# Patient Record
Sex: Male | Born: 1950 | Race: White | Hispanic: No | Marital: Married | Smoking: Never smoker
Health system: Southern US, Community
[De-identification: ages and names within clinical notes are randomized; demographics above are authoritative.]

## PROBLEM LIST (undated history)

## (undated) DIAGNOSIS — K633 Ulcer of intestine: Secondary | ICD-10-CM

## (undated) DIAGNOSIS — K5790 Diverticulosis of intestine, part unspecified, without perforation or abscess without bleeding: Secondary | ICD-10-CM

## (undated) DIAGNOSIS — K219 Gastro-esophageal reflux disease without esophagitis: Secondary | ICD-10-CM

## (undated) DIAGNOSIS — K648 Other hemorrhoids: Secondary | ICD-10-CM

## (undated) DIAGNOSIS — I493 Ventricular premature depolarization: Secondary | ICD-10-CM

## (undated) DIAGNOSIS — K589 Irritable bowel syndrome without diarrhea: Secondary | ICD-10-CM

## (undated) DIAGNOSIS — R112 Nausea with vomiting, unspecified: Secondary | ICD-10-CM

## (undated) DIAGNOSIS — K449 Diaphragmatic hernia without obstruction or gangrene: Secondary | ICD-10-CM

## (undated) DIAGNOSIS — Z974 Presence of external hearing-aid: Secondary | ICD-10-CM

## (undated) DIAGNOSIS — Z9889 Other specified postprocedural states: Secondary | ICD-10-CM

## (undated) HISTORY — DX: Ulcer of intestine: K63.3

## (undated) HISTORY — DX: Ventricular premature depolarization: I49.3

## (undated) HISTORY — DX: Other hemorrhoids: K64.8

## (undated) HISTORY — DX: Diverticulosis of intestine, part unspecified, without perforation or abscess without bleeding: K57.90

## (undated) HISTORY — PX: TONSILLECTOMY: SUR1361

## (undated) HISTORY — PX: COLON SURGERY: SHX602

## (undated) HISTORY — PX: NASAL SEPTUM SURGERY: SHX37

## (undated) HISTORY — DX: Irritable bowel syndrome, unspecified: K58.9

## (undated) HISTORY — DX: Diaphragmatic hernia without obstruction or gangrene: K44.9

## (undated) HISTORY — DX: Gastro-esophageal reflux disease without esophagitis: K21.9

---

## 2002-09-14 ENCOUNTER — Encounter: Payer: Self-pay | Admitting: Gastroenterology

## 2002-09-14 ENCOUNTER — Ambulatory Visit (HOSPITAL_COMMUNITY): Admission: RE | Admit: 2002-09-14 | Discharge: 2002-09-14 | Payer: Self-pay | Admitting: Gastroenterology

## 2002-09-19 ENCOUNTER — Encounter: Payer: Self-pay | Admitting: Gastroenterology

## 2004-10-14 ENCOUNTER — Ambulatory Visit: Payer: Self-pay | Admitting: Gastroenterology

## 2004-10-20 ENCOUNTER — Ambulatory Visit: Payer: Self-pay | Admitting: Gastroenterology

## 2004-11-11 ENCOUNTER — Ambulatory Visit: Payer: Self-pay | Admitting: Gastroenterology

## 2005-02-06 ENCOUNTER — Encounter: Admission: RE | Admit: 2005-02-06 | Discharge: 2005-02-06 | Payer: Self-pay | Admitting: General Surgery

## 2005-02-10 ENCOUNTER — Ambulatory Visit (HOSPITAL_BASED_OUTPATIENT_CLINIC_OR_DEPARTMENT_OTHER): Admission: RE | Admit: 2005-02-10 | Discharge: 2005-02-10 | Payer: Self-pay | Admitting: General Surgery

## 2005-02-10 ENCOUNTER — Encounter (INDEPENDENT_AMBULATORY_CARE_PROVIDER_SITE_OTHER): Payer: Self-pay | Admitting: Specialist

## 2005-02-10 ENCOUNTER — Ambulatory Visit (HOSPITAL_COMMUNITY): Admission: RE | Admit: 2005-02-10 | Discharge: 2005-02-10 | Payer: Self-pay | Admitting: General Surgery

## 2005-10-22 ENCOUNTER — Ambulatory Visit: Payer: Self-pay | Admitting: Gastroenterology

## 2006-01-14 ENCOUNTER — Ambulatory Visit: Payer: Self-pay | Admitting: Gastroenterology

## 2006-09-29 ENCOUNTER — Ambulatory Visit: Payer: Self-pay | Admitting: Gastroenterology

## 2007-01-04 ENCOUNTER — Encounter: Admission: RE | Admit: 2007-01-04 | Discharge: 2007-01-04 | Payer: Self-pay | Admitting: Family Medicine

## 2007-01-26 ENCOUNTER — Encounter: Admission: RE | Admit: 2007-01-26 | Discharge: 2007-01-26 | Payer: Self-pay | Admitting: General Surgery

## 2007-04-04 ENCOUNTER — Inpatient Hospital Stay (HOSPITAL_COMMUNITY): Admission: RE | Admit: 2007-04-04 | Discharge: 2007-04-08 | Payer: Self-pay | Admitting: General Surgery

## 2007-04-04 ENCOUNTER — Encounter (INDEPENDENT_AMBULATORY_CARE_PROVIDER_SITE_OTHER): Payer: Self-pay | Admitting: Specialist

## 2007-04-04 ENCOUNTER — Encounter (INDEPENDENT_AMBULATORY_CARE_PROVIDER_SITE_OTHER): Payer: Self-pay | Admitting: *Deleted

## 2007-06-13 ENCOUNTER — Ambulatory Visit (HOSPITAL_BASED_OUTPATIENT_CLINIC_OR_DEPARTMENT_OTHER): Admission: RE | Admit: 2007-06-13 | Discharge: 2007-06-13 | Payer: Self-pay | Admitting: Urology

## 2007-06-13 ENCOUNTER — Encounter (INDEPENDENT_AMBULATORY_CARE_PROVIDER_SITE_OTHER): Payer: Self-pay | Admitting: Urology

## 2007-10-03 ENCOUNTER — Ambulatory Visit: Payer: Self-pay | Admitting: Gastroenterology

## 2008-02-03 DIAGNOSIS — K648 Other hemorrhoids: Secondary | ICD-10-CM | POA: Insufficient documentation

## 2008-02-03 DIAGNOSIS — K449 Diaphragmatic hernia without obstruction or gangrene: Secondary | ICD-10-CM | POA: Insufficient documentation

## 2008-02-03 DIAGNOSIS — K219 Gastro-esophageal reflux disease without esophagitis: Secondary | ICD-10-CM

## 2008-02-03 DIAGNOSIS — K589 Irritable bowel syndrome without diarrhea: Secondary | ICD-10-CM

## 2008-02-03 DIAGNOSIS — K2980 Duodenitis without bleeding: Secondary | ICD-10-CM | POA: Insufficient documentation

## 2008-02-03 DIAGNOSIS — K5730 Diverticulosis of large intestine without perforation or abscess without bleeding: Secondary | ICD-10-CM | POA: Insufficient documentation

## 2008-04-21 IMAGING — RF DG BE W/ CM - WO/W KUB
16 series · 16 of 16 positions shown · non-contrast
Comparison: none

CLINICAL DATA: Recurrent diverticulitis.  
 BARIUM ENEMA: 
 Scout KUB demonstrates that the patient has bilateral pars defects at L5 with degenerative disc disease at that level.  The bowel gas pattern is normal.  The entire colon was filled in the normal retrograde Nya.  There was reflux into the terminal ileum.  The patient has extensive diverticulosis of the sigmoid portion of the colon as well as numerous diverticula in the entire descending colon with some scattered diverticula in the distal transverse colon and some rare diverticula in the ascending colon.  No mass lesions or polyps.  No evidence of extravasation.  No strictures or evidence of acute diverticulitis at this time.  
 Appendix was not visualized.

[Series 1: run · 1 of 1 slices shown (1 of 14)]
[im 1/1]
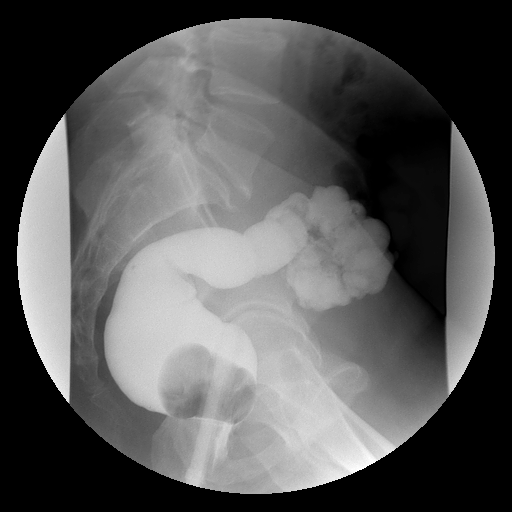

[Series 2: run · 1 of 1 slices shown (2 of 14)]
[im 1/1]
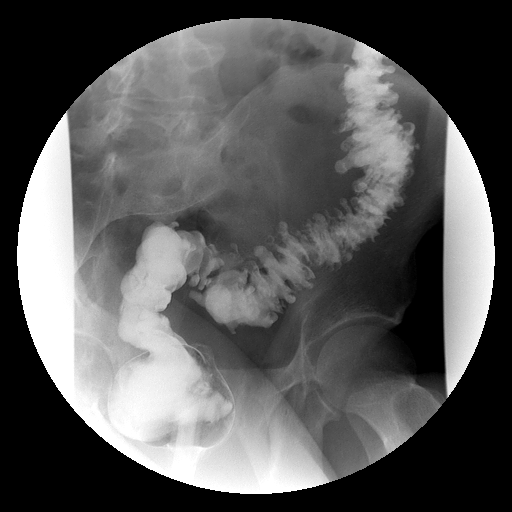

[Series 3: run · 1 of 1 slices shown (3 of 14)]
[im 1/1]
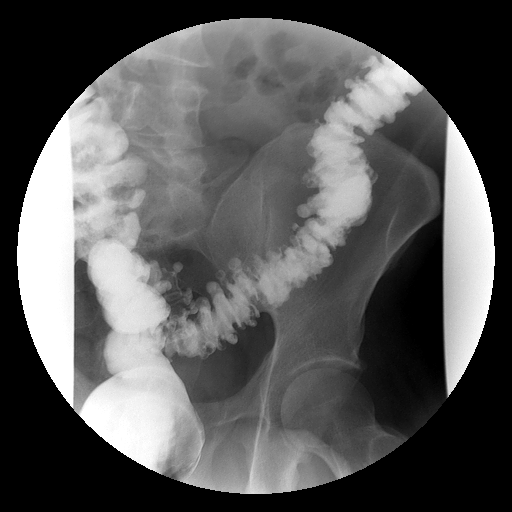

[Series 4: run · 1 of 1 slices shown (4 of 14)]
[im 1/1]
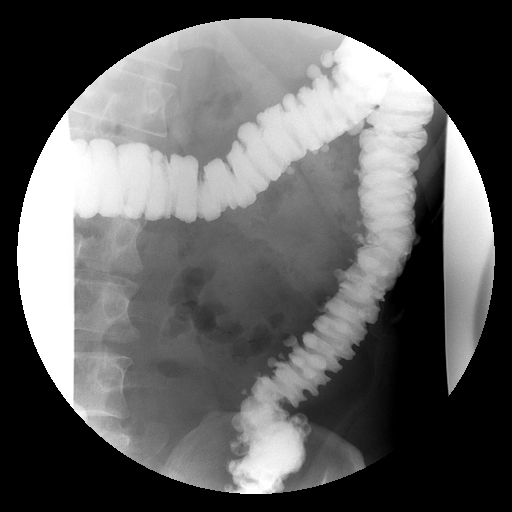

[Series 5: run · 1 of 1 slices shown (5 of 14)]
[im 1/1]
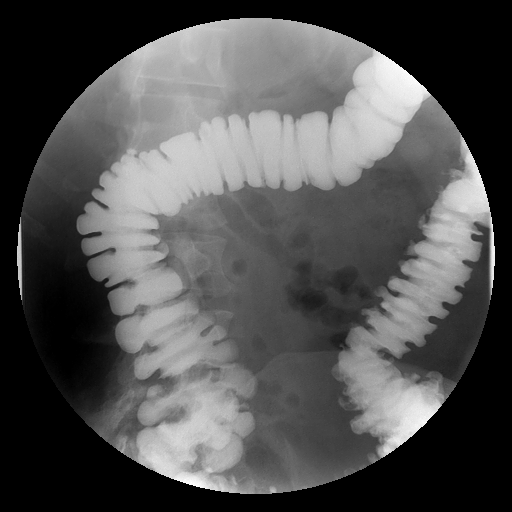

[Series 6: run · 1 of 1 slices shown (6 of 14)]
[im 1/1]
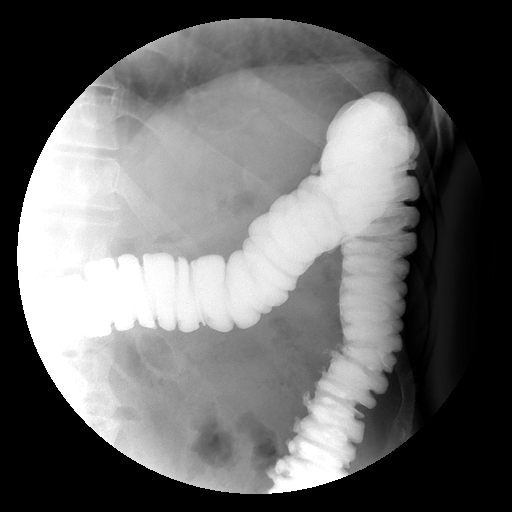

[Series 7: run · 1 of 1 slices shown (7 of 14)]
[im 1/1]
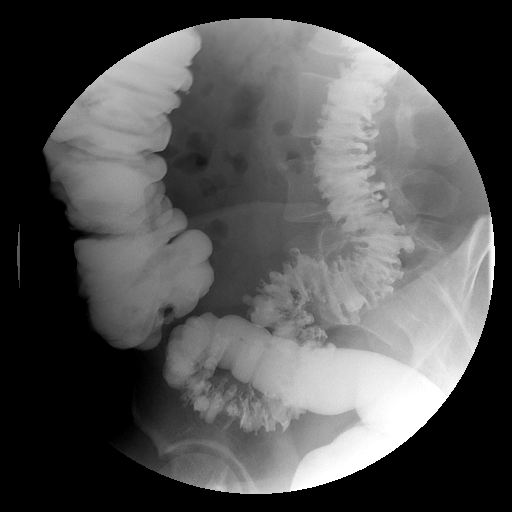

[Series 8: run · 1 of 1 slices shown (8 of 14)]
[im 1/1]
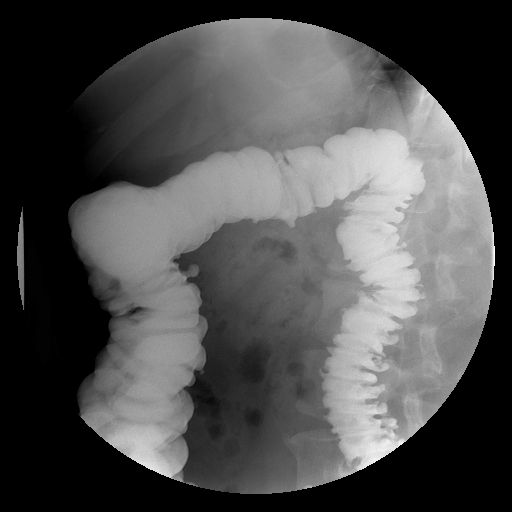

[Series 9: run · 1 of 1 slices shown (9 of 14)]
[im 1/1]
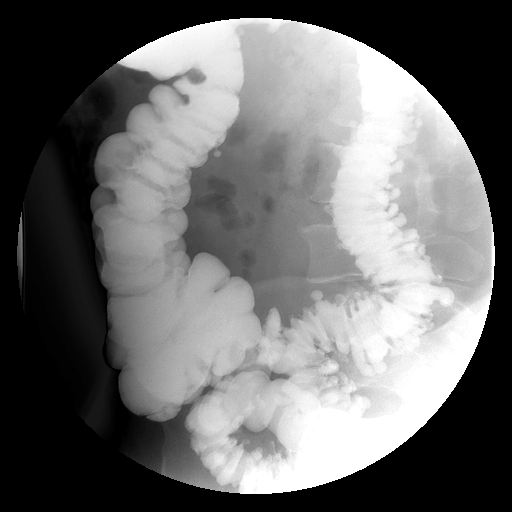

[Series 10: run · 1 of 1 slices shown (10 of 14)]
[im 1/1]
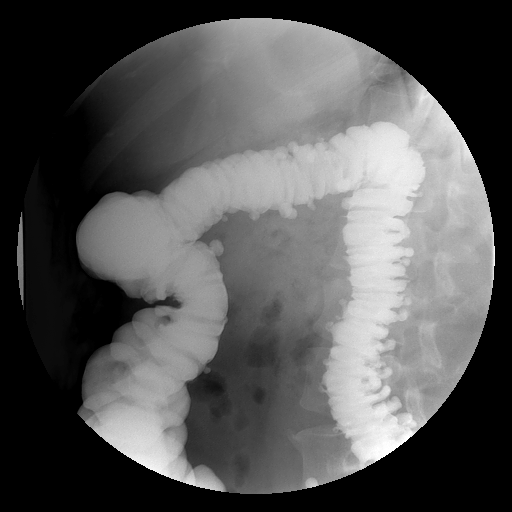

[Series 11: run · 1 of 1 slices shown (11 of 14)]
[im 1/1]
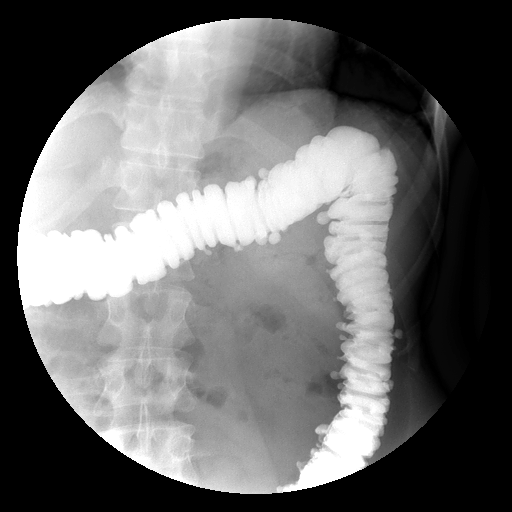

[Series 12: run · 1 of 1 slices shown (12 of 14)]
[im 1/1]
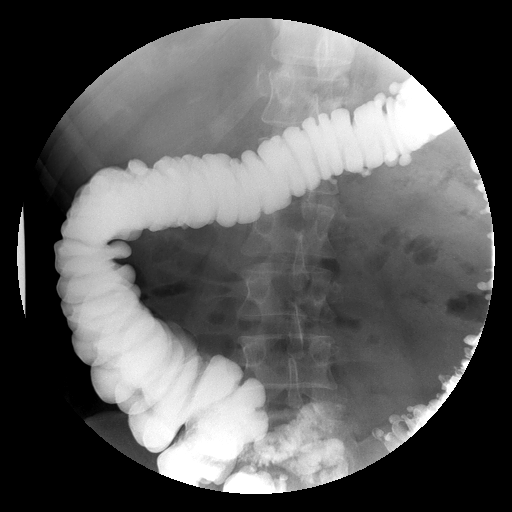

[Series 13: run · 1 of 1 slices shown (13 of 14)]
[im 1/1]
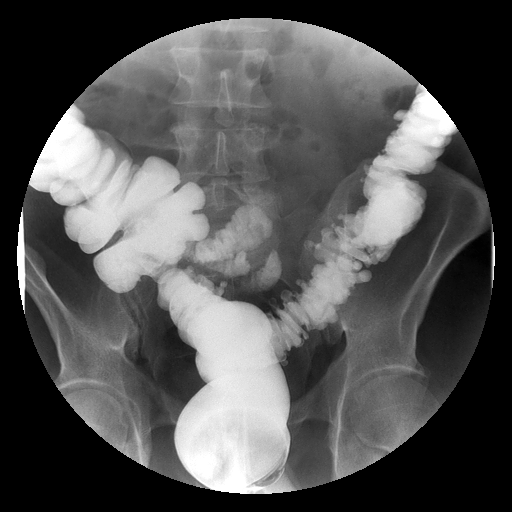

[Series 14: run · 1 of 1 slices shown (14 of 14)]
[im 1/1]
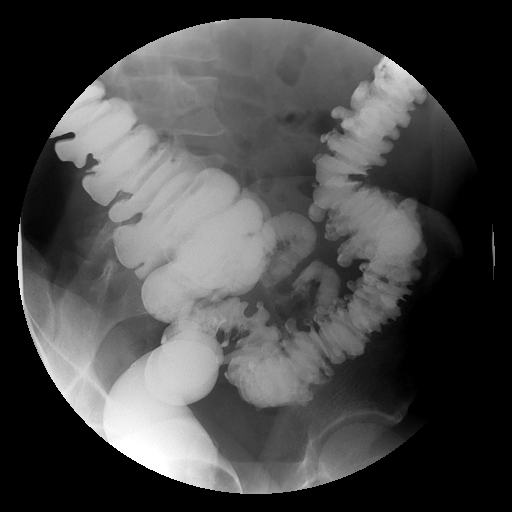

[Series 1001: view not recorded · 0.20mm/px · 1 of 1 slices shown (1 of 2)]
[im 1/1]
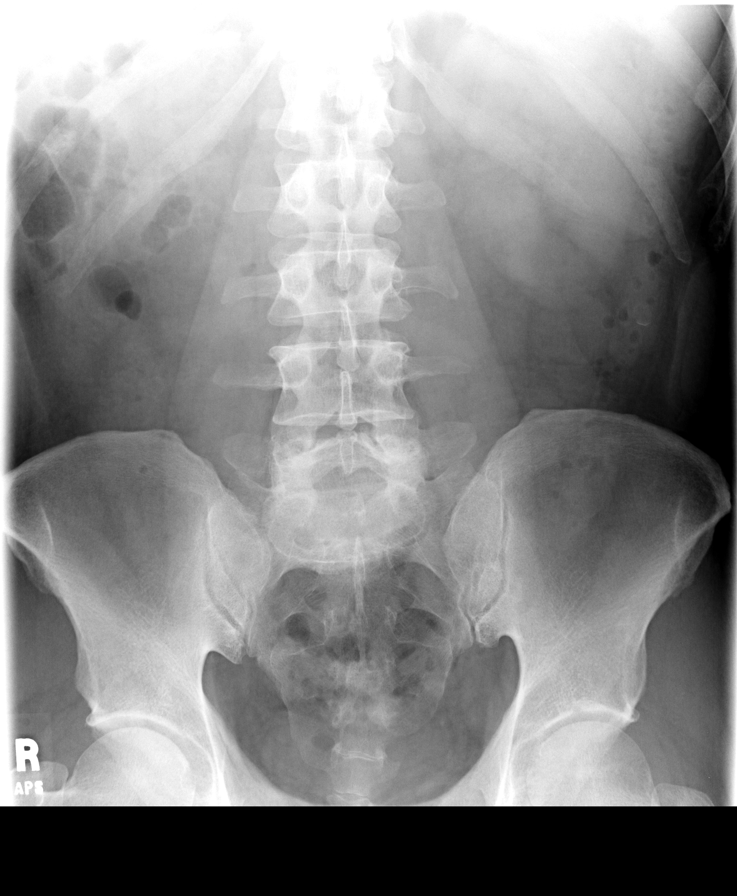

[Series 1002: view not recorded · 0.20mm/px · 1 of 1 slices shown (2 of 2)]
[im 1/1]
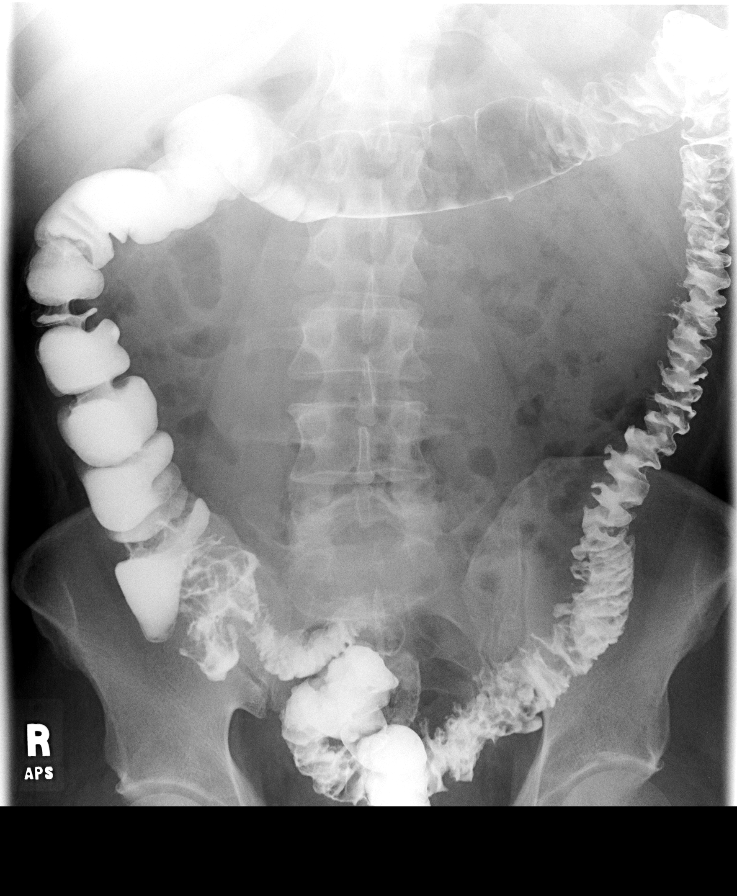

[16 of 16 positions shown; findings below may reference images not displayed]

IMPRESSION: Extensive diverticulosis of the descending and sigmoid portions of the colon with a few scattered diverticula in the remainder of the colon.

## 2008-09-19 ENCOUNTER — Ambulatory Visit: Payer: Self-pay | Admitting: Gastroenterology

## 2008-11-05 ENCOUNTER — Telehealth: Payer: Self-pay | Admitting: Gastroenterology

## 2009-10-14 ENCOUNTER — Ambulatory Visit: Payer: Self-pay | Admitting: Gastroenterology

## 2010-10-10 ENCOUNTER — Telehealth: Payer: Self-pay | Admitting: Gastroenterology

## 2010-11-24 ENCOUNTER — Ambulatory Visit: Payer: Self-pay | Admitting: Gastroenterology

## 2010-11-25 ENCOUNTER — Ambulatory Visit: Payer: Self-pay | Admitting: Gastroenterology

## 2011-01-06 NOTE — Progress Notes (Signed)
Summary: refills?  Medications Added NEXIUM 40 MG CPDR (ESOMEPRAZOLE MAGNESIUM) one tablet by mouth two times a day       Phone Note Call from Patient Call back at Home Phone [phone]   Caller: Patient Call For: Dr. Russella Dar Reason for Call: Talk to Nurse Summary of Call: wants to know if he has any more refills for Nexium or if he needs to have an ov first Initial call taken by: Vallarie Mare,  October 10, 2010 9:44 AM  Follow-up for Phone Call        Scheduled pt for a follow-up visit on 11/24/10 at 3:15pm. Pt verbalized understanding. Pt given refills until his appt.  Follow-up by: Christie Nottingham CMA Duncan Dull),  October 10, 2010 10:06 AM    New/Updated Medications: NEXIUM 40 MG CPDR (ESOMEPRAZOLE MAGNESIUM) one tablet by mouth two times a day Prescriptions: NEXIUM 40 MG CPDR (ESOMEPRAZOLE MAGNESIUM) one tablet by mouth two times a day  #60 x 1   Entered by:   Christie Nottingham CMA (AAMA)   Authorized by:   Meryl Dare MD [facility]   Signed by:   Christie Nottingham CMA (AAMA) on 10/10/2010   Method used:   Electronically to        [facility] [id number] (retail)       [address]       [place], [place]  [address]       Ph: [phone]       Fax: [phone]   RxID:   [id number]

## 2011-01-08 NOTE — Assessment & Plan Note (Signed)
Summary: GERD f/u, yearly f/u/all   History of Present Illness Visit Type: Follow-up Visit Primary GI MD: Elie Goody MD [facility] Primary Provider: Lonie Peak, PA Requesting Provider: na Chief Complaint: Yearly f/u for GERD. Pt denies any GI complaints  History of Present Illness:   Mr. Ging returns for followup of GERD. He states his reflux symptoms remain under very good control on Nexium twice daily. Occasionally he notes regurgitation after eating a large meal. He has no other gastrointestinal complaints.   GI Review of Systems      Denies abdominal pain, acid reflux, belching, bloating, chest pain, dysphagia with liquids, dysphagia with solids, heartburn, loss of appetite, nausea, vomiting, vomiting blood, weight loss, and  weight gain.        Denies anal fissure, black tarry stools, change in bowel habit, constipation, diarrhea, diverticulosis, fecal incontinence, heme positive stool, hemorrhoids, irritable bowel syndrome, jaundice, light color stool, liver problems, rectal bleeding, and  rectal pain. Current Medications (verified): 1)  Folic Acid 1 Mg Tabs (Folic Acid) .... Take One By Mouth Once Daily 2)  Nexium 40 Mg Cpdr (Esomeprazole Magnesium) .... One Tablet By Mouth Two Times A Day  Allergies (verified): No Known Drug Allergies  Past History:  Past Medical History: GERD Irritable Bowel Syndrome Diverticulosis/diverticulitis Hemorrhoids Mild idiopathic cardiomyopathy 1996 PVC's Hiatal hernia Colonic Ulcer   Past Surgical History: Reviewed history from 10/14/2009 and no changes required. injections and banding of hemorrhoids in 2001& 2002 sinus surgery Sogmoid colectomy, diverticulitis, 03/2007  Family History: Reviewed history from 10/14/2009 and no changes required. No FH of Colon Cancer  Social History: Reviewed history from 10/14/2009 and no changes required. Patient has never smoked.  Alcohol Use - yes rare Daily Caffeine Use- 3 cups  daily Illicit Drug Use - no Patient does not get regular exercise.   Vital Signs:  Patient profile:   60 year old male Height:      72 inches Weight:      200 pounds BMI:     27.22 BSA:     2.13 Pulse rate:   96 / minute Pulse rhythm:   regular BP sitting:   128 / 74  (left arm) Cuff size:   regular  Vitals Entered By: Ok Anis CMA (November 24, 2010 3:21 PM)  Physical Exam  General:  Well developed, well nourished, no acute distress. Head:  Normocephalic and atraumatic. Eyes:  PERRLA, no icterus. Mouth:  No deformity or lesions, dentition normal. Lungs:  Clear throughout to auscultation. Heart:  Regular rate and rhythm; no murmurs, rubs,  or bruits. Abdomen:  Soft, nontender and nondistended. No masses, hepatosplenomegaly or hernias noted. Normal bowel sounds. Psych:  Alert and cooperative. Normal mood and affect.  Impression & Recommendations:  Problem # 1:  GASTROESOPHAGEAL REFLUX DISEASE (ICD-530.81) Continue Nexium 40 mg twice daily, and standard antireflux measures. Scheduled a bone density study screen for osteoporosis, osteopenia. Orders: T-Bone Densitometry [phone])  Problem # 2:  SCREENING COLORECTAL-CANCER (ICD-V76.51) Colon cancer screening due October 2013.  Patient Instructions: 1)  You have been scheduled for a Bone Density scan.  2)  Nexium has been sent to your pharmacy. 3)  Please schedule a follow-up appointment in 1 year. 4)  Copy sent to : Lonie Peak, PA 5)  The medication list was reviewed and reconciled.  All changed / newly prescribed medications were explained.  A complete medication list was provided to the patient / caregiver.  Prescriptions: NEXIUM 40 MG CPDR (ESOMEPRAZOLE MAGNESIUM) one tablet by  mouth two times a day  #68 x 11   Entered by:   Christie Nottingham CMA (AAMA)   Authorized by:   Meryl Dare MD [facility]   Signed by:   Christie Nottingham CMA (AAMA) on 11/24/2010   Method used:   Electronically to        [facility] [id number]  (retail)       [address]       [place], [place]  [address]       Ph: [phone]       Fax: [phone]   RxID:   [id number]

## 2011-04-21 NOTE — Op Note (Signed)
[name] Simmons, [name] Simmons                ACCOUNT NO.:  [id number]   MEDICAL RECORD NO.:  [id number]          PATIENT TYPE:  AMB   LOCATION:  [facility]                         FACILITY:  [facility]   PHYSICIAN:  Valetta Fuller, M.D.  DATE OF BIRTH:  1951-11-24   DATE OF PROCEDURE:  06/13/2007  DATE OF DISCHARGE:                               OPERATIVE REPORT   PREOPERATIVE DIAGNOSIS:  Left cystic epididymal mass.   POSTOPERATIVE DIAGNOSIS:  Left cystic epididymal mass.   PROCEDURE:  Scrotal exploration with excision of left complex  multiloculated spermatocele and partial epididectomy.   SURGEON:  Valetta Fuller, M.D.   ANESTHESIA:  General.   INDICATIONS FOR PROCEDURE:  [name] Simmons is a 60 year old male.  I  evaluated him back in February of this year.  The patient had had a CT  scan which had shown  some diverticulitis.  He also had an abnormality  in the scrotum.  An ultrasound was done which showed a 4 x 5-cm complex  cystic structure above his left epididymis.  Clinically this was clearly  cystic in nature.  We felt that given its location in the head of the  epididymis and the fact that it was not in any way involving the  testicle, that it was almost certainly benign.  At that time he was  relatively asymptomatic, but we suggested that this was some benign  cystic degeneration of the epididymis along potentially with a  spermatocele.  We talked to him about the pros and cons of surgical  intervention but did not feel that it was absolutely essential unless  this became more symptomatic to him.  The patient recently called saying  that he was having more increased discomfort, especially with long  drives or increased activity and requested that this be handled  surgically.  We did discuss the pros and cons of surgery with him.  We  talked about the nature of the surgery and the recovery issues as well  as the potential complications and problems.  Full informed consent was  obtained.   TECHNIQUE AND FINDINGS:  The patient was brought to the operating room.  He had successful induction of general anesthesia.  He was prepped and  draped in the usual manner.   A standard scrotal incision along the median raphe was performed.  The  left hemiscrotal compartment was entered.  The tunica vaginalis itself  was thickened and showed evidence of some chronic inflammation.  A scant  amount of hydrocele fluid was obtained which was clear.  The testis  itself was palpably and visually normal.  Involving the entire head of  the left epididymis was a complex cystic mass measuring about 4 x 6 cm.  This was multiloculated and appeared to be intimately associated with  the epididymis.  No normal epididymal structures were encountered.  Utilizing a combination of blunt and sharp dissection technique as well  as some electrocautery, the head of the epididymis was taken off of the  top portion of the testis.  The cystic mass was then carefully dissected  away from surrounding spermatic  cord structures.  The testicular  vasculature was preserved throughout, and the testis remained pink and  viable.  This cystic abnormality showed multiloculations but otherwise  seemed to have clear fluid.  There was no evidence of a solid component,  and this again appeared to be cystic degeneration of the epididymis with  multiloculated spermatocele.  Again, the entire head of the epididymis  along with this cystic abnormality was removed.  Hemostasis was  otherwise quite good.  The spermatic cord otherwise had normal  structures, and a Marcaine spermatic cord block was performed.  Again,  the testis remained completely viable.  It was returned to the left  hemiscrotum, taking care not to twist the testis in any way.  The tunica  vaginalis was widely opened but otherwise left intact.  The scrotum was  then closed with two layers.  A 3-0 Vicryl suture was used to close the  dartos.  The skin was closed with a  running 4-0 Vicryl suture.  A light  pressure dressing was applied, and a Tegaderm was placed over the  incision.   The patient appeared to tolerate the procedure well.  There were no  obvious complications or problems.           ______________________________  Valetta Fuller, M.D.  Electronically Signed     DSG/MEDQ  D:  06/13/2007  T:  06/13/2007  Job:  [id number]

## 2011-04-21 NOTE — Assessment & Plan Note (Signed)
[facility]                         GASTROENTEROLOGY OFFICE NOTE   NAME:[name] Simmons, [name] Simmons                       MRN:          [id number]  DATE:10/03/2007                            DOB:          July 09, 1951    This a return office visit for GERD.  He notes occasional breakthrough  symptoms with spicy foods and green peppers.  He has occasional  regurgitation and occasional nighttime reflux.  He feels his symptoms  are under good to very good control.  He has no dysphagia, odynophagia,  weight loss, change in bowel habits, melena or hematochezia.   CURRENT MEDICATIONS:  Listed on the chart, updated and reviewed.   MEDICATION ALLERGIES:  PENICILLIN.   EXAMINATION:  No acute distress.  Weight 192.8 pounds, blood pressure is  110/70, pulse 64 and regular.  CHEST:  Clear to auscultation bilaterally.  CARDIAC:  Regular rate and rhythm without murmurs.  ABDOMEN:  Soft and nontender with normoactive bowel sounds.   ASSESSMENT/PLAN:  1. Gastroesophageal reflux disease.  Symptoms under good to very good      control.  He will re-intensify anti-reflux measures and avoid      trigger foods.  He may use Tums on a p.r.n. basis for breakthrough      symptoms.  Renewed Nexium 40 mg b.i.d. 30 minutes before breakfast      and dinner for 1 year.  Return office visit 1 year.  2. Colorectal cancer screening.  Recall colonoscopy recommended for      October 2013.     Venita Lick. Russella Dar, MD, [facility]  Electronically Signed    MTS/MedQ  DD: 10/03/2007  DT: 10/03/2007  Job #: [id number]

## 2011-04-24 NOTE — Discharge Summary (Signed)
NAMECARDEN, TEEL                ACCOUNT NO.:  [id number]   MEDICAL RECORD NO.:  [id number]          PATIENT TYPE:  INP   LOCATION:  1534                         FACILITY:  [facility]   PHYSICIAN:  Adolph Pollack, M.D.DATE OF BIRTH:  1951/08/23   DATE OF ADMISSION:  04/04/2007  DATE OF DISCHARGE:  04/08/2007                               DISCHARGE SUMMARY   PRINCIPAL DISCHARGE DIAGNOSIS:  Recurrent sigmoid diverticulitis.   SECONDARY DIAGNOSES:  1. Mild idiopathic cardiomyopathy.  2. Peptic ulcer disease.  3. Irritable bowel syndrome.  4. Hiatal hernia.  5. Nephrolithiasis.   PROCEDURE:  Laparoscopic-assisted sigmoid colectomy.   REASON FOR ADMISSION:  Mr. Fosdick is a 60 year old male who has had  recurring bouts of sigmoid diverticulitis.  He was subsequently referred  to consider a sigmoid colectomy because of his recurring bouts and he  now presents for that.   HOSPITAL COURSE:  The patient underwent the above procedure which he  tolerated well.  Postoperatively he had a relatively unremarkable postop  course.  We were able to advance his diet.  Bowel function returned.  Incision was clean, dry and intact.  He had no complications and by his  fourth postop day, Apr 08, 2007, he was ready to be discharged.   DISPOSITION:  Discharged to home Apr 08, 2007.  He will return to see me  in the office in 2 days to have his staples removed.  He is given  discharge instructions, told to continue his usual medications and will  take Tylox for pain.      Adolph Pollack, M.D.  Electronically Signed     TJR/MEDQ  D:  04/25/2007  T:  04/25/2007  Job:  [id number]

## 2011-04-24 NOTE — Op Note (Signed)
NAMEARTURO, [name] Simmons                ACCOUNT NO.:  [id number]   MEDICAL RECORD NO.:  [id number]          PATIENT TYPE:  AMB   LOCATION:  DSC                          FACILITY:  [facility]   PHYSICIAN:  Adolph Pollack, M.D.DATE OF BIRTH:  [birth year]   DATE OF PROCEDURE:  02/10/2005  DATE OF DISCHARGE:                                 OPERATIVE REPORT   PREOPERATIVE DIAGNOSIS:  Abdominal wall mass.   POSTOPERATIVE DIAGNOSIS:  A 3 cm abdominal wall mass.   PROCEDURE:  Excision of 3 cm abdominal wall mass.   SURGEON:  Adolph Pollack, M.D.   ANESTHESIA:  Local plus MAC.   INDICATION:  This 60 year old male has noticed a slowly growing abdominal  wall mass just to the left of his epigastric midline area.  It is fairly  deep and non-mobile.  He now presents for excision.  The procedure and the  risks were discussed with him preoperatively.   TECHNIQUE:  He is seen in the holding area and the mass marked with my  initials.  He was then brought to the operating room, placed supine on the  operating table and given intravenous sedation.  Hair  was clipped in the  epigastric region and area was sterilely prepped and draped.  Local  anesthetic was infiltrated directly over the mass and a transfer incision  was made directly over the mass through the skin and subcutaneous tissue.  The mass was firm and fixed.  I raised small flaps in all directions.  I  then dissected the mass free from the surrounding subcutaneous tissue and  the fascia.  No hernia was noted, no perforation of the fascia was noted.  The mass was measured 3 cm and sent to pathology.   I then injected some local anesthetic into the deep subcutaneous tissues.  Bleeding was controlled with electrocautery.  I then closed the wound in 2  layers, closing the subcutaneous tissue with a running 3-0 Vicryl suture and  then the skin with a 4-0 Monocryl subcuticular suture.  Steri-Strips and a  sterile dressing were applied.   He  tolerated the procedure well without any apparent complications.  He was  taken to the recovery room in satisfactory condition.      TJR/MEDQ  D:  02/10/2005  T:  02/10/2005  Job:  [id number]

## 2011-04-24 NOTE — Assessment & Plan Note (Signed)
[facility]                           GASTROENTEROLOGY OFFICE NOTE   NAME:[name] Simmons, [name] Simmons                       MRN:          [id number]  DATE:09/29/2006                            DOB:          07/18/1951    REASON FOR VISIT:  Return office visit for GERD.  He has frequent  breakthrough symptoms occurring about every-other day, mainly in relation to  meals.  They are brief and well controlled with over-the-counter antacids.  He takes his Nexium twice a day, about 30-60 minutes before breakfast and  dinner as prescribed and he follows most antireflux measures except for  often eating within an hour of bedtime.  He has no active irritable bowel  syndrome symptoms and he remains on Robinul twice a day.  He relates no  dysphagia, odynophagia, weight loss, abdominal pain, rectal pain, change in  bowel habits, change in stool caliber, melena, or hematochezia.   Current medications listed on the chart have been reviewed.   MEDICATION ALLERGIES:  PENICILLIN.   EXAMINATION:  GENERAL:  No acute distress.  VITAL SIGNS:  Weight 199.4 pounds, blood pressure is 110/70, pulse 72 and  regular.  CHEST:  Clear to auscultation bilaterally.  CARDIAC:  Regular rate and rhythm without murmur.  ABDOMEN:  Soft and nontender with normal active bowel sounds.  No palpable  organomegaly, masses or hernias.   ASSESSMENT AND PLAN:  1. Gastroesophageal reflux disease with symptoms under good control.  He      is to re-intensify all antireflux measures and specifically avoid any      food or fluids within 3-4 hours of bedtime.  Written literature is      supplied to the patient.  Nexium 40 mg p.o. b.i.d. renewed for 1 year.  2. Irritable bowel syndrome.  Symptoms inactive.  May use Robinul Forte      b.i.d. p.r.n.  3. Colon cancer screening.  Recall colonoscopy recommended for October      2013 for screening.       Venita Lick. Russella Dar, MD, [facility]      MTS/MedQ  DD:   09/29/2006  DT:  09/30/2006  Job #:  [id number]

## 2011-04-24 NOTE — H&P (Signed)
NAMEDEMAREE, LIBERTO                ACCOUNT NO.:  [id number]   MEDICAL RECORD NO.:  [id number]          PATIENT TYPE:  INP   LOCATION:  0001                         [facility]ACILITY[facility]   PHYSICIAN:  Adolph Pollack, M.D.DATE OF BIRTH:  03-09-1951   DATE OF ADMISSION:  04/04/2007  DATE OF DISCHARGE:                              HISTORY & PHYSICAL   REASON FOR ADMISSION:  Elective sigmoid colectomy.   HISTORY OF PRESENT ILLNESS:  Mr. Kaeser is a 60 year old male who has had  recurring bouts of sigmoid diverticulitis treated with antibiotics.  Because of his recurrent episodes, he was referred for possible sigmoid  colectomy.  A barium enema demonstrates significant diverticular disease  in the sigmoid area and some in the distal descending colon.  He is  admitted for the above operation.   PAST MEDICAL HISTORY:  1. Mild idiopathic cardiomyopathy.  2. Peptic ulcer related to nonsteroidal treatment.  3. Diverticulosis with recurrent diverticulitis.  4. Irritable bowel syndrome.  5. Folate deficiency.  6. Hiatal hernia and gastroesophageal reflux.  7. History of PVCs.  8. Angiolipoma of the abdominal wall.  9. Nephrolithiasis.  10.Hemorrhoids.  11.Anal fissure.  12.Allergic rhinitis.   PREVIOUS OPERATIONS:  1. Repair of deviated septum.  2. Removal of angiolipoma from the abdominal wall.  3. Tonsillectomy and adenoidectomy.  4. Hemorrhoidal banding.   ALLERGIES:  AMOXICILLIN.   MEDICATIONS:  Include:  1. Nexium.  2. Folic acid.  3. Glycopyrrolate.   SOCIAL HISTORY:  No tobacco or alcohol use.   REVIEW OF SYSTEMS:  Essentially unremarkable.   PHYSICAL EXAM:  GENERAL:  A well-developed, well-nourished male in no  acute distress, pleasant, cooperative.  VITAL SIGNS:  Temperature is 96.7 blood pressure is 128/75, pulse 90.  EYES:  Extraocular motions intact.  No icterus.  NECK:  Supple without obvious masses.  RESPIRATORY:  Breath sounds equal and clear, respirations  unlabored.  CARDIOVASCULAR:  Demonstrates a regular rate, regular rhythm.  No murmur  heard.  ABDOMEN:  Soft.  Minimal left lower quadrant tenderness.  No palpable  masses or organomegaly.  EXTREMITIES: Notable for SCDs on the lower extremities.   IMPRESSION:  Recurrent sigmoid diverticulitis.   PLAN:  Laparoscopic-assisted sigmoid colectomy.  We discussed the  procedure and risks preoperatively.      Adolph Pollack, M.D.  Electronically Signed     TJR/MEDQ  D:  04/04/2007  T:  04/04/2007  Job:  [id number]

## 2011-04-24 NOTE — Op Note (Signed)
[name] Simmons, DRINKARD                ACCOUNT NO.:  [id number]   MEDICAL RECORD NO.:  [id number]          PATIENT TYPE:  INP   LOCATION:  0001                         FACILITY:  [facility]   PHYSICIAN:  Adolph Pollack, M.D.DATE OF BIRTH:  01-06-1951   DATE OF PROCEDURE:  04/04/2007  DATE OF DISCHARGE:                               OPERATIVE REPORT   PREOP DIAGNOSIS:  Recurrent sigmoid diverticulitis.   POSTOPERATIVE DIAGNOSIS:  Recurrent sigmoid diverticulitis.   PROCEDURES:  1. Laparoscopic-assisted sigmoid colectomy.  2. Partial mobilization of splenic flexure.   SURGEON:  Adolph Pollack, M.D.   ASSISTANT:  Currie Paris, M.D.   ANESTHESIA:  General.   INDICATIONS:  This is a 60 year old man who has had recurrent bouts of  sigmoid diverticulitis treated with antibiotics.  He now presents for  elective resection.   TECHNIQUE:  He was brought to the operating room, placed supine on the  operating table, and a general anesthetic was administered.  A Foley  catheter was placed in his bladder; and he was then placed in the  lithotomy position.  The abdomen, pelvis, and perianal area were  sterilely prepped and draped.  A supraumbilical incision was made  through the skin, subcutaneous tissue, fascia, and peritoneum.  A  pursestring suture of #0 Vicryl was placed around the fascial edges.  A  Hassan trocar was introduced into the peritoneal cavity; and  a  pneumoperitoneum created by insufflation of CO2 gas.   Next, the laparoscope was introduced.  Adhesions were noted in the left  lower quadrant and left pelvis area between the large intestine; and the  pelvic sidewall.  An 11-mm trocar was placed in the right lower  quadrant.  A 5-mm trocar was then placed in the suprapubic region; and  one in the left lower quadrant.  Using the harmonic scalpel some of  these adhesions between the sigmoid colon and the pelvis were divided.  I then divided the lateral attachments of  the sigmoid colon to the  abdominal wall.  I mobilized the descending colon by dividing its  lateral attachments.  There was some omentum adherent to the descending  colon and I divided these adhesions with the harmonic scalpel.  I  partially mobilized the splenic flexure by dividing these peritoneal  attachments with the harmonic scalpel.  This allowed the descending  colon to drop more.  I then reviewed the pelvis area; and could see the  diseased segment.  I was able to mobilize this up out of the pelvis.  I  identified the left ureter and kept the plane of dissection anterior to  it.  I divided the peritoneal attachments between the distal sigmoid  colon and the abdominal wall and then mobilized the rectosigmoid colon  and about half of the rectum as well.   Following this I removed the supraumbilical trocar and made a transverse  Pfannenstiel-type incision through the skin and subcutaneous tissue.  I  divided the fascia transversely, then separated the rectus muscle, and  incised the peritoneum, entering the peritoneal cavity.  I grasped the  diseased segment  of sigmoid colon, and brought it up into the wound.  The rectosigmoid was mobile.  Half of the descending colon was fairly  mobile as well; and I was able to bring it up through the wound.  I  divided the colon at the rectosigmoid junction with a GIA stapler.  I  then divided the mesenteric vessels using a LigaSure and ligating, but  staying inside.  I identified the normal-appearing area of the  descending colon; and divided the colon, here, with a GIA stapler.   Following this I performed an end-to-end anastomosis using a single  layer of 3-0 silk interrupted sutures.  The anastomosis was patent,  viable, and under no tension.  I then irrigated out the abdominal cavity  and evacuated fluid.  Just proximal to the anastomosis the bowel was  occluded.  A proctosigmoidoscopy was performed and air was insufflated;  and the  anastomosis was put under water.  No air leak was noted, thus  the anastomosis was airtight.   Gloves and gowns were changed.  I then further irrigated the abdominal  cavity, and evacuated the fluid.  Tisseel was placed around the  anastomosis.  I requested a needle, sponge, and instrument count; and it  was reported be correct at this time.  I then approximated the  peritoneum with a running 3-0 Vicryl suture.  The fascia was  approximated with a running #1 PDS suture.  I reinsufflated the abdomen;  and inspected the area laparoscopically.  Some remaining irrigation  fluid was evacuated.  There did not appear to be any active bleeding.   I then released the CO2 gas and removed the trocars.  The supraumbilical  fascial defect was closed by tightening up and tying down the  pursestring suture.  The subcutaneous tissue was irrigated of all  incisions: and all the incisions were closed with staples followed by a  sterile dressings.  He tolerated the procedure without any apparent  complications; and was taken to recovery in satisfactory condition.      Adolph Pollack, M.D.  Electronically Signed     TJR/MEDQ  D:  04/04/2007  T:  04/04/2007  Job:  [id number]   cc:   [facility] Lonie Peak, [place]

## 2011-10-07 ENCOUNTER — Encounter (INDEPENDENT_AMBULATORY_CARE_PROVIDER_SITE_OTHER): Payer: Self-pay | Admitting: General Surgery

## 2011-10-07 ENCOUNTER — Ambulatory Visit (INDEPENDENT_AMBULATORY_CARE_PROVIDER_SITE_OTHER): Payer: 59 | Admitting: General Surgery

## 2011-10-07 VITALS — BP 132/84 | HR 68 | Temp 96.9°F | Resp 14 | Ht 72.0 in | Wt 208.2 lb

## 2011-10-07 DIAGNOSIS — K648 Other hemorrhoids: Secondary | ICD-10-CM

## 2011-10-07 NOTE — Patient Instructions (Signed)
Call if you have heavy, persistent bleeding.

## 2011-10-07 NOTE — Progress Notes (Signed)
Chief Complaint  Patient presents with  . Rectal Problems    Recheck recurrent hemorrhoids    HPI [name] Simmons is a 60 y.o. male.   HPIMr. Fakhouri has noted something prolapsing out of his anus intermittently after a bowel movement. It occurred 2 months ago than recent occurred 2 weeks ago. There is no bleeding or pain. It reduces spontaneously. No constipation. I saw him about one year ago for recurrent bleeding internal hemorrhoids. He responded well to rubber band ligation.  Past Medical History  Diagnosis Date  . GERD (gastroesophageal reflux disease)   . Hemorrhoids     Past Surgical History  Procedure Date  . Colon surgery     Patient does not remember date of sx    History reviewed. No pertinent family history.  Social History History  Substance Use Topics  . Smoking status: Never Smoker   . Smokeless tobacco: Never Used  . Alcohol Use: No    No Known Allergies  Current Outpatient Prescriptions  Medication Sig Dispense Refill  . folic acid (FOLVITE) 1 MG tablet       . NEXIUM 40 MG capsule         Review of Systems Review of Systems  Constitutional: Positive for unexpected weight change (weight gain).  Gastrointestinal:       Some crampy lower abdominal pain prior to a BM. No rectal bleeding.    Blood pressure 132/84, pulse 68, temperature 96.9 F (36.1 C), temperature source Temporal, resp. rate 14, height 6' (1.829 m), weight 208 lb 3.2 oz (94.439 kg).  Physical Exam Physical Exam  Constitutional: He appears well-developed and well-nourished. No distress.  Genitourinary:       No external lesions.  No mass or blood on DRE.  Anoscopy-enlarged right sided internal hemorrhoid.  Rubber band ligation performed and he tolerated this well.    Data Reviewed na Assessment    Prolapsing right sided internal hemorrhoid.  Rubber band ligation performed.  May need a repeat ligation.    Plan     Return visit in 3 weeks. Call for heavy bleeding.         Taneika Choi J 10/07/2011, 10:17 AM

## 2011-10-28 ENCOUNTER — Ambulatory Visit (INDEPENDENT_AMBULATORY_CARE_PROVIDER_SITE_OTHER): Payer: Self-pay | Admitting: Gastroenterology

## 2011-10-28 ENCOUNTER — Encounter: Payer: Self-pay | Admitting: Gastroenterology

## 2011-10-28 ENCOUNTER — Encounter (INDEPENDENT_AMBULATORY_CARE_PROVIDER_SITE_OTHER): Payer: Self-pay

## 2011-10-28 VITALS — BP 104/76 | HR 84 | Ht 72.0 in | Wt 206.0 lb

## 2011-10-28 DIAGNOSIS — Z1211 Encounter for screening for malignant neoplasm of colon: Secondary | ICD-10-CM

## 2011-10-28 DIAGNOSIS — K219 Gastro-esophageal reflux disease without esophagitis: Secondary | ICD-10-CM

## 2011-10-28 MED ORDER — ESOMEPRAZOLE MAGNESIUM 40 MG PO CPDR: 40.0000 mg | DELAYED_RELEASE_CAPSULE | Freq: Two times a day (BID) | ORAL | Status: DC

## 2011-10-28 NOTE — Progress Notes (Signed)
History of Present Illness: This is a 60 year old male returning for followup of GERD. He has been completely asymptomatic. He wants to discuss decreasing or discontinuing Nexium. He has had reflux problems for many years that were previously not well controlled on a daily proton pump inhibitor and therefore he has been treated with twice a day for a for several years with excellent results. Denies weight loss, abdominal pain, constipation, diarrhea, change in stool caliber, melena, hematochezia, nausea, vomiting, dysphagia, reflux symptoms, chest pain.  Current Medications, Allergies, Past Medical History, Past Surgical History, Family History and Social History were reviewed in [employer] record.  Physical Exam: General: Well developed , well nourished, no acute distress Head: Normocephalic and atraumatic Eyes:  sclerae anicteric, EOMI Ears: Normal auditory acuity Mouth: No deformity or lesions Lungs: Clear throughout to auscultation Heart: Regular rate and rhythm; no murmurs, rubs or bruits Abdomen: Soft, non tender and non distended. No masses, hepatosplenomegaly or hernias noted. Normal Bowel sounds Extremities: No clubbing, cyanosis, edema or deformities noted Neurological: Alert oriented x 4, grossly nonfocal Psychological:  Alert and cooperative. Normal mood and affect  Assessment and Recommendations:  1. GERD. Continue all standard antireflux measures. Patient will attempt to decrease Nexium to once daily and if this is tolerated he should taper to every other day, then every third day and then discontinue. If his symptoms return will need to reinstitute a PPI.  2. Colorectal cancer screening. Average risk. Colonoscopy due October 2013.

## 2011-10-28 NOTE — Patient Instructions (Addendum)
Your prescription for Nexium has been sent to your pharmacy.  Please follow up in a year.

## 2011-11-02 ENCOUNTER — Encounter (INDEPENDENT_AMBULATORY_CARE_PROVIDER_SITE_OTHER): Payer: Self-pay

## 2011-11-02 ENCOUNTER — Encounter: Payer: Self-pay | Admitting: Gastroenterology

## 2011-11-03 ENCOUNTER — Encounter (INDEPENDENT_AMBULATORY_CARE_PROVIDER_SITE_OTHER): Payer: 59 | Admitting: General Surgery

## 2012-02-01 ENCOUNTER — Telehealth: Payer: Self-pay | Admitting: Gastroenterology

## 2012-02-01 NOTE — Telephone Encounter (Signed)
Left a message for patient to return my call. 

## 2012-02-02 NOTE — Telephone Encounter (Signed)
Received letter of approval from insurance company regarding Nexium. Notified patient of approval and that he can get it from his pharmacy now.

## 2012-07-28 ENCOUNTER — Encounter: Payer: Self-pay | Admitting: Internal Medicine

## 2012-10-24 ENCOUNTER — Other Ambulatory Visit: Payer: Self-pay | Admitting: Gastroenterology

## 2012-10-24 MED ORDER — ESOMEPRAZOLE MAGNESIUM 40 MG PO CPDR: 40.0000 mg | DELAYED_RELEASE_CAPSULE | Freq: Two times a day (BID) | ORAL | Status: DC

## 2012-10-24 NOTE — Telephone Encounter (Signed)
Sent a new prescription for Nexium to patient's pharmacy with just one refill until patient schedules an office visit.

## 2012-10-31 ENCOUNTER — Other Ambulatory Visit: Payer: Self-pay

## 2012-10-31 MED ORDER — ESOMEPRAZOLE MAGNESIUM 40 MG PO CPDR: 40.0000 mg | DELAYED_RELEASE_CAPSULE | Freq: Two times a day (BID) | ORAL | Status: DC

## 2012-11-07 ENCOUNTER — Encounter: Payer: Self-pay | Admitting: Gastroenterology

## 2012-11-07 ENCOUNTER — Ambulatory Visit (INDEPENDENT_AMBULATORY_CARE_PROVIDER_SITE_OTHER): Payer: 59 | Admitting: Gastroenterology

## 2012-11-07 VITALS — BP 100/66 | HR 88 | Ht 72.0 in | Wt 205.8 lb

## 2012-11-07 DIAGNOSIS — K219 Gastro-esophageal reflux disease without esophagitis: Secondary | ICD-10-CM

## 2012-11-07 DIAGNOSIS — Z1211 Encounter for screening for malignant neoplasm of colon: Secondary | ICD-10-CM

## 2012-11-07 MED ORDER — ESOMEPRAZOLE MAGNESIUM 40 MG PO CPDR: 40.0000 mg | DELAYED_RELEASE_CAPSULE | Freq: Two times a day (BID) | ORAL | Status: DC

## 2012-11-07 NOTE — Progress Notes (Signed)
History of Present Illness: This is a 61 year old male with chronic GERD who returns for followup. States his reflux symptoms are under very good control on Nexium 40 mg twice a day. Temporarily treated with NSAIDs for joint pains which led to dyspeptic symptoms he discontinued those medications. Denies weight loss, abdominal pain, constipation, diarrhea, change in stool caliber, melena, hematochezia, nausea, vomiting, dysphagia, reflux symptoms, chest pain.  Current Medications, Allergies, Past Medical History, Past Surgical History, Family History and Social History were reviewed in [employer] record.  Physical Exam: General: Well developed , well nourished, no acute distress Head: Normocephalic and atraumatic Eyes:  sclerae anicteric, EOMI Ears: Normal auditory acuity Mouth: No deformity or lesions Lungs: Clear throughout to auscultation Heart: Regular rate and rhythm; no murmurs, rubs or bruits Abdomen: Soft, non tender and non distended. No masses, hepatosplenomegaly or hernias noted. Normal Bowel sounds Musculoskeletal: Symmetrical with no gross deformities  Pulses:  Normal pulses noted Extremities: No clubbing, cyanosis, edema or deformities noted Neurological: Alert oriented x 4, grossly nonfocal Psychological:  Alert and cooperative. Normal mood and affect  Assessment and Recommendations:  1. GERD. Continue standard antireflux measures and Nexium 40 mg twice a day.  2. Colorectal cancer screening, routine risk. He was recommended to proceed with screening colonoscopy since it is has been 10 years since his last colonoscopy however he declines. I explained the risk of colon polyps and colon cancer and he continues to decline. I advised him if he changes his mind to contact us.

## 2012-11-07 NOTE — Patient Instructions (Addendum)
We have sent the following medications to your pharmacy for you to pick up at your convenience:  Nexium  

## 2012-12-14 ENCOUNTER — Ambulatory Visit (INDEPENDENT_AMBULATORY_CARE_PROVIDER_SITE_OTHER): Payer: 59 | Admitting: General Surgery

## 2012-12-14 ENCOUNTER — Encounter (INDEPENDENT_AMBULATORY_CARE_PROVIDER_SITE_OTHER): Payer: Self-pay | Admitting: General Surgery

## 2012-12-14 VITALS — BP 128/72 | HR 76 | Temp 97.8°F | Resp 16 | Ht 72.0 in | Wt 199.2 lb

## 2012-12-14 DIAGNOSIS — K648 Other hemorrhoids: Secondary | ICD-10-CM

## 2012-12-14 NOTE — Progress Notes (Signed)
Subjective:     Patient ID: [name] Simmons, male   DOB: [birth year], 62 y.o.   MRN: [id number]  HPI  He presents complaining of some prolapsing of an internal hemorrhoid. This happened after Thanksgiving. I perform rubber band ligation of a right-sided hemorrhoid in October 2013.   Review of Systems  He denies constipation. He denies any rectal bleeding. He denies any pain.     Objective:   Physical Exam General-he looks well and is in no acute distress.  Anorectal-no external lesions or fissures; on digital rectal exam there are no palpable masses and the prostate is smooth without nodules. No blood is present.  Anoscopy-there is a small to moderate size smooth internal hemorrhoid in the right posterior area.    Assessment:     Internal hemorrhoids-no bleeding; hemorrhoid on the right side does not need treatment in my opinion at this time. He is overdue for colonoscopy.    Plan:     Return visit if he has any further problems with hemorrhoids. I encouraged him to get the colonoscopy.

## 2012-12-14 NOTE — Patient Instructions (Signed)
Call if you have any further problems with the hemorrhoids. I recommend you consider getting a colonoscopy.

## 2013-01-02 ENCOUNTER — Other Ambulatory Visit: Payer: Self-pay

## 2013-01-02 MED ORDER — ESOMEPRAZOLE MAGNESIUM 40 MG PO CPDR: 40.0000 mg | DELAYED_RELEASE_CAPSULE | Freq: Two times a day (BID) | ORAL | Status: DC

## 2013-03-30 ENCOUNTER — Encounter: Payer: Self-pay | Admitting: Gastroenterology

## 2013-12-26 ENCOUNTER — Other Ambulatory Visit: Payer: Self-pay | Admitting: Gastroenterology

## 2013-12-27 ENCOUNTER — Ambulatory Visit (INDEPENDENT_AMBULATORY_CARE_PROVIDER_SITE_OTHER): Payer: 59 | Admitting: Gastroenterology

## 2013-12-27 ENCOUNTER — Encounter: Payer: Self-pay | Admitting: Gastroenterology

## 2013-12-27 VITALS — BP 114/64 | HR 88 | Ht 71.5 in | Wt 198.1 lb

## 2013-12-27 DIAGNOSIS — K219 Gastro-esophageal reflux disease without esophagitis: Secondary | ICD-10-CM

## 2013-12-27 DIAGNOSIS — Z1211 Encounter for screening for malignant neoplasm of colon: Secondary | ICD-10-CM

## 2013-12-27 MED ORDER — ESOMEPRAZOLE MAGNESIUM 40 MG PO CPDR: 40.0000 mg | DELAYED_RELEASE_CAPSULE | Freq: Two times a day (BID) | ORAL | Status: DC

## 2013-12-27 NOTE — Progress Notes (Signed)
    History of Present Illness: This is a 63 year old male with chronic GERD. He states his reflux symptoms are generally controlled on Nexium 40 mg every morning. In the past he has required PPI twice a day therapy for symptom control. He is due for colorectal cancer screening and he received letters from us in 2013 and in 2014 regarding returning for screening colonoscopy but he did not follow through. He does not have a primary physician.  Current Medications, Allergies, Past Medical History, Past Surgical History, Family History and Social History were reviewed in [facility] electronic medical record.  Physical Exam: General: Well developed , well nourished, no acute distress Head: Normocephalic and atraumatic Eyes:  sclerae anicteric, EOMI Ears: Normal auditory acuity Mouth: No deformity or lesions Lungs: Clear throughout to auscultation Heart: Regular rate and rhythm; no murmurs, rubs or bruits Abdomen: Soft, non tender and non distended. No masses, hepatosplenomegaly or hernias noted. Normal Bowel sounds Rectal: Pt declined Musculoskeletal: Symmetrical with no gross deformities  Pulses:  Normal pulses noted Extremities: No clubbing, cyanosis, edema or deformities noted Neurological: Alert oriented x 4, grossly nonfocal Psychological:  Alert and cooperative. Normal mood and affect  Assessment and Recommendations:  1. GERD. Standard antireflux measures and Nexium 40 mg every morning.  2. Colorectal cancer screening, average risk. He is advised to proceed with colonoscopy, or at least stool Hemoccults, or stool Cologuard but he declines all options.

## 2013-12-27 NOTE — Patient Instructions (Signed)
We have sent the following medications to your pharmacy for you to pick up at your convenience: Nexium.  It has been recommended to you by your physician that you have a(n) Colonoscopy completed. Per your request, we did not schedule the procedure(s) today. Please contact our office at [phone] should you decide to have the procedure completed.  Thank you for choosing me and [facility].  Venita LickMalcolm T. Pleas KochStark, Jr., MD., Clementeen GrahamFACG

## 2014-12-26 ENCOUNTER — Other Ambulatory Visit: Payer: Self-pay | Admitting: Gastroenterology

## 2015-01-09 ENCOUNTER — Ambulatory Visit (INDEPENDENT_AMBULATORY_CARE_PROVIDER_SITE_OTHER): Payer: 59 | Admitting: Gastroenterology

## 2015-01-09 ENCOUNTER — Encounter: Payer: Self-pay | Admitting: Gastroenterology

## 2015-01-09 VITALS — BP 106/70 | HR 92 | Ht 71.5 in | Wt 199.0 lb

## 2015-01-09 DIAGNOSIS — K219 Gastro-esophageal reflux disease without esophagitis: Secondary | ICD-10-CM

## 2015-01-09 DIAGNOSIS — Z1211 Encounter for screening for malignant neoplasm of colon: Secondary | ICD-10-CM

## 2015-01-09 MED ORDER — ESOMEPRAZOLE MAGNESIUM 40 MG PO CPDR: 40.0000 mg | DELAYED_RELEASE_CAPSULE | Freq: Two times a day (BID) | ORAL | Status: DC

## 2015-01-09 MED ORDER — ESOMEPRAZOLE MAGNESIUM 40 MG PO CPDR
40.0000 mg | DELAYED_RELEASE_CAPSULE | Freq: Two times a day (BID) | ORAL | Status: DC
Start: 2015-01-09 — End: 2015-01-09

## 2015-01-09 NOTE — Patient Instructions (Signed)
We have sent the following medications to your pharmacy for you to pick up at your convenience:Nexium.   It has been recommended to you by your physician that you have a(n) Colonoscopy/Cologuard completed and also get established with a Primary Care Physician. Per your request, we did not schedule the procedure(s) today. Please contact our office at [phone] should you decide to have the procedure completed.  Thank you for choosing me and [facility].  Venita LickMalcolm T. Pleas KochStark, Jr., MD., Clementeen GrahamFACG

## 2015-01-09 NOTE — Progress Notes (Signed)
    History of Present Illness: This is a   No Known Allergies Outpatient Prescriptions Prior to Visit  Medication Sig Dispense Refill  . esomeprazole (NEXIUM) 40 MG capsule TAKE ONE CAPSULE BY MOUTH TWICE DAILY 60 capsule 0   No facility-administered medications prior to visit.   Past Medical History  Diagnosis Date  . GERD (gastroesophageal reflux disease)   . Hiatal hernia   . IBS (irritable bowel syndrome)   . Diverticulosis   . Cardiomyopathy 1996  . PVC (premature ventricular contraction)   . Internal hemorrhoids   . Colonic ulcer    Past Surgical History  Procedure Laterality Date  . Colon surgery      Patient does not remember date of sx  . Nasal septum surgery    . Tonsillectomy     History   Social History  . Marital Status: Married    Spouse Name: N/A    Number of Children: 2  . Years of Education: N/A   Occupational History  . self employed    Social History Main Topics  . Smoking status: Never Smoker   . Smokeless tobacco: Never Used  . Alcohol Use: No  . Drug Use: No  . Sexual Activity: None   Other Topics Concern  . None   Social History Narrative   History reviewed. No pertinent family history.     Physical Exam: General: Well developed , well nourished, no acute distress Head: Normocephalic and atraumatic Eyes:  sclerae anicteric, EOMI Ears: Normal auditory acuity Mouth: No deformity or lesions Lungs: Clear throughout to auscultation Heart: Regular rate and rhythm; no murmurs, rubs or bruits Abdomen: Soft, non tender and non distended. No masses, hepatosplenomegaly or hernias noted. Normal Bowel sounds Musculoskeletal: Symmetrical with no gross deformities  Pulses:  Normal pulses noted Extremities: No clubbing, cyanosis, edema or deformities noted Neurological: Alert oriented x 4, grossly nonfocal Psychological:  Alert and cooperative. Normal mood and affect   Assessment and Recommendations:  1. GERD. Standard antireflux  measures and Nexium 40 mg bid. REV in 1 year and prn.  2. Colorectal cancer screening, average risk. He is advised to proceed with colonoscopy, stool Cologuard or stool iFOB and he declines all options.  3. Primary care. Advised to establish with a PCP. He declines.  Over 50% of the time of the 20 minutes I spent with him was counseling the patient regarding appropriate medical care.

## 2015-12-06 ENCOUNTER — Telehealth: Payer: Self-pay | Admitting: Gastroenterology

## 2015-12-06 NOTE — Telephone Encounter (Signed)
Informed patient that we normally will do the PA when the rejection comes from the pharmacy. Patient states he just wanted to give us a heads up. Patient states he just refilled his medication and will not need another refill for another 3 weeks.

## 2016-01-02 ENCOUNTER — Telehealth: Payer: Self-pay | Admitting: Gastroenterology

## 2016-01-02 MED ORDER — ESOMEPRAZOLE MAGNESIUM 40 MG PO CPDR: 40.0000 mg | DELAYED_RELEASE_CAPSULE | Freq: Two times a day (BID) | ORAL | Status: DC

## 2016-01-02 NOTE — Telephone Encounter (Signed)
Prescription for Nexium sent to the pharmacy. Will obtain PA when we receive rejection fax.

## 2016-02-03 ENCOUNTER — Telehealth: Payer: Self-pay | Admitting: Gastroenterology

## 2016-02-03 ENCOUNTER — Ambulatory Visit (INDEPENDENT_AMBULATORY_CARE_PROVIDER_SITE_OTHER): Payer: 59 | Admitting: Gastroenterology

## 2016-02-03 ENCOUNTER — Encounter: Payer: Self-pay | Admitting: Gastroenterology

## 2016-02-03 VITALS — BP 100/62 | HR 80 | Ht 72.0 in | Wt 197.1 lb

## 2016-02-03 DIAGNOSIS — K219 Gastro-esophageal reflux disease without esophagitis: Secondary | ICD-10-CM

## 2016-02-03 DIAGNOSIS — Z1211 Encounter for screening for malignant neoplasm of colon: Secondary | ICD-10-CM

## 2016-02-03 MED ORDER — ESOMEPRAZOLE MAGNESIUM 40 MG PO CPDR: 40.0000 mg | DELAYED_RELEASE_CAPSULE | Freq: Two times a day (BID) | ORAL | Status: DC

## 2016-02-03 NOTE — Telephone Encounter (Signed)
Patient upset that the colorectal cancer screening code was on his AVS.  He feels putting that on his AVS indicates he has cancer.  I attempted to explain that this is simply a ICD 10 code listing of their discussion. It does not in any way indicate he has cancer.  He hung up on me.

## 2016-02-03 NOTE — Progress Notes (Signed)
    History of Present Illness: This is a 65 year old male with a history of GERD returning for follow up. He notes heartburn occurring about 2 times per week generally when he has spicy foods otherwise his reflux symptoms are well controlled. He underwent colonoscopy in 2003 that showed a benign ulcer on the ileocecal valve diverticulosis and internal hemorrhoids. He underwent EGD in 2005 which showed a small hiatal hernia and duodenitis. Denies weight loss, abdominal pain, constipation, diarrhea, change in stool caliber, melena, hematochezia, nausea, vomiting, dysphagia,chest pain.  Current Medications, Allergies, Past Medical History, Past Surgical History, Family History and Social History were reviewed in [employer] record.  Physical Exam: General: Well developed, well nourished, no acute distress Head: Normocephalic and atraumatic Eyes:  sclerae anicteric, EOMI Ears: Normal auditory acuity Mouth: No deformity or lesions Lungs: Clear throughout to auscultation Heart: Regular rate and rhythm; no murmurs, rubs or bruits Abdomen: Soft, non tender and non distended. No masses, hepatosplenomegaly or hernias noted. Normal Bowel sounds Musculoskeletal: Symmetrical with no gross deformities  Pulses:  Normal pulses noted Extremities: No clubbing, cyanosis, edema or deformities noted Neurological: Alert oriented x 4, grossly nonfocal Psychological:  Alert and cooperative. Normal mood and affect  Assessment and Recommendations:  1. GERD. Standard antireflux measures and continue Nexium 40 mg bid. REV in 1 year and prn.  2. Colorectal cancer screening, average risk. He is advised to proceed with colonoscopy, ACBE, virtual colonoscopy, Cologuard or iFOB and he declines all options without giving a specific reason.  3. Primary care. Advised to establish with a PCP. Offered to arrange an appt with a PCP. He declines.  I spent 15 minutes of face-to-face time with the  patient. Greater than 50% of the time was spent counseling and coordinating care.

## 2016-02-03 NOTE — Patient Instructions (Signed)
We have sent the following medications to your pharmacy for you to pick up at your convenience:Nexium.   Thank you for choosing me and [facility].  Malcolm T. Stark, Jr., MD., FACG   

## 2016-04-15 ENCOUNTER — Encounter: Payer: Self-pay | Admitting: Podiatry

## 2016-04-15 ENCOUNTER — Ambulatory Visit (INDEPENDENT_AMBULATORY_CARE_PROVIDER_SITE_OTHER): Payer: 59

## 2016-04-15 ENCOUNTER — Ambulatory Visit (INDEPENDENT_AMBULATORY_CARE_PROVIDER_SITE_OTHER): Payer: 59 | Admitting: Podiatry

## 2016-04-15 VITALS — BP 109/77 | HR 79 | Resp 16 | Ht 72.0 in | Wt 200.0 lb

## 2016-04-15 DIAGNOSIS — M205X2 Other deformities of toe(s) (acquired), left foot: Secondary | ICD-10-CM | POA: Diagnosis not present

## 2016-04-15 DIAGNOSIS — M79672 Pain in left foot: Secondary | ICD-10-CM | POA: Diagnosis not present

## 2016-04-15 DIAGNOSIS — M779 Enthesopathy, unspecified: Secondary | ICD-10-CM

## 2016-04-15 MED ORDER — TRIAMCINOLONE ACETONIDE 10 MG/ML IJ SUSP
10.0000 mg | Freq: Once | INTRAMUSCULAR | Status: AC
  Administered 2016-04-15: 10 mg

## 2016-04-15 NOTE — Progress Notes (Signed)
Subjective:     Patient ID: [name] Simmons, male   DOB: [birth year], 65 y.o.   MRN: [id number]  HPI patient presents with a painful first metatarsophalangeal joint left with spurring within the joint and states he had a surgery done a number of years ago that did not resolve the problem. It hurts when he bends it and he hurts when he is on it and he is not sure as to the problem and was referred by another physician   Review of Systems  All other systems reviewed and are negative.      Objective:   Physical Exam  Constitutional: He is oriented to person, place, and time.  Cardiovascular: Intact distal pulses.   Musculoskeletal: Normal range of motion.  Neurological: He is oriented to person, place, and time.  Skin: Skin is warm.  Nursing note and vitals reviewed.  neurovascular status intact muscle strength adequate range of motion within normal limits with patient found to have reduced range of motion first MPJ left with quite a bit of discomfort in the joint itself when palpated especially on the lateral and dorsal side. 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 I did a injection of the lateral side of the joint and across the dorsal surface 3 mg Kenalog 5 mill grams Xylocaine and advised we can try to make him orthotics to prevent surgery but most likely use continue to joint implantation procedure. Patient be seen back in 2 weeks to review  X-ray report indicates Narrowing of the joint surface first MPJ left with what appears to be probable bone spur within the joint surface itself and indications of arthritis

## 2016-04-15 NOTE — Progress Notes (Signed)
   Subjective:    Patient ID: [name] Simmons, male    DOB: 1951-09-13, 65 y.o.   MRN: [id number]  HPI Chief Complaint  Patient presents with  . Foot Pain    Left foot; great toe-joint; pt stated, "Saw Dr. Elijah Birkom a month ago and was told had a bone fragment in joint of big toe; recommended to have surgery"      Review of Systems  HENT: Positive for sinus pressure.   Musculoskeletal: Positive for arthralgias.  All other systems reviewed and are negative.      Objective:   Physical Exam        Assessment & Plan:

## 2016-04-29 ENCOUNTER — Ambulatory Visit: Payer: 59 | Admitting: Podiatry

## 2017-02-08 ENCOUNTER — Telehealth: Payer: Self-pay | Admitting: Gastroenterology

## 2017-02-09 MED ORDER — ESOMEPRAZOLE MAGNESIUM 40 MG PO CPDR: 40.0000 mg | DELAYED_RELEASE_CAPSULE | Freq: Two times a day (BID) | ORAL | 1 refills | Status: DC

## 2017-02-09 NOTE — Telephone Encounter (Signed)
Left a message for patient informing him that I [facility].

## 2017-03-22 ENCOUNTER — Encounter (INDEPENDENT_AMBULATORY_CARE_PROVIDER_SITE_OTHER): Payer: Self-pay

## 2017-03-22 ENCOUNTER — Ambulatory Visit (INDEPENDENT_AMBULATORY_CARE_PROVIDER_SITE_OTHER): Payer: Medicare Other | Admitting: Gastroenterology

## 2017-03-22 ENCOUNTER — Encounter: Payer: Self-pay | Admitting: Gastroenterology

## 2017-03-22 VITALS — BP 102/60 | HR 76 | Ht 71.5 in | Wt 195.2 lb

## 2017-03-22 DIAGNOSIS — Z1212 Encounter for screening for malignant neoplasm of rectum: Secondary | ICD-10-CM | POA: Diagnosis not present

## 2017-03-22 DIAGNOSIS — Z1211 Encounter for screening for malignant neoplasm of colon: Secondary | ICD-10-CM | POA: Diagnosis not present

## 2017-03-22 DIAGNOSIS — K219 Gastro-esophageal reflux disease without esophagitis: Secondary | ICD-10-CM

## 2017-03-22 MED ORDER — ESOMEPRAZOLE MAGNESIUM 40 MG PO CPDR: 40.0000 mg | DELAYED_RELEASE_CAPSULE | Freq: Two times a day (BID) | ORAL | 11 refills | Status: DC

## 2017-03-22 NOTE — Patient Instructions (Addendum)
Your physician has requested that you go to the basement for the following lab work before leaving today:  Ifob.   We have sent the following medications to your pharmacy for you to pick up at your convenience: Nexium. You can decrease to once daily dosing if needed.   Thank you for choosing me and [facility].  Venita Lick. Pleas Koch., MD., Clementeen Graham

## 2017-03-22 NOTE — Progress Notes (Signed)
    History of Present Illness: This is a 66 year old male returning for follow-up of GERD. He relates his reflux symptoms are well controlled on Nexium. Occasionally he takes his Nexium once a day and so twice a day and notes good symptom control. He has no other gastrointestinal complaints. Denies weight loss, abdominal pain, constipation, diarrhea, change in stool caliber, melena, hematochezia, nausea, vomiting, dysphagia, reflux symptoms, chest pain.  Current Medications, Allergies, Past Medical History, Past Surgical History, Family History and Social History were reviewed in [employer] record.  Physical Exam: General: Well developed, well nourished, no acute distress Head: Normocephalic and atraumatic Eyes:  sclerae anicteric, EOMI Ears: Normal auditory acuity Mouth: No deformity or lesions Lungs: Clear throughout to auscultation Heart: Regular rate and rhythm; no murmurs, rubs or bruits Abdomen: Soft, non tender and non distended. No masses, hepatosplenomegaly or hernias noted. Normal Bowel sounds Musculoskeletal: Symmetrical with no gross deformities  Pulses:  Normal pulses noted Extremities: No clubbing, cyanosis, edema or deformities noted Neurological: Alert oriented x 4, grossly nonfocal Psychological:  Alert and cooperative. Normal mood and affect  Assessment and Recommendations:  1. GERD. Continue standard antireflux measures and reduce Nexium 40 mg to once daily. If his symptoms are not adequately controlled resume Nexium twice daily.  2. CRC screening, average risk. He is advised to proceed with colonoscopy, Cologuard, air-contrast BE or virtual colonoscopy and he declines these CRC screening options. He agrees to iFOB testing.   3. Advised patient to establish with a PCP in offered to assist in referral to states he will consider.

## 2017-04-08 ENCOUNTER — Other Ambulatory Visit (INDEPENDENT_AMBULATORY_CARE_PROVIDER_SITE_OTHER): Payer: Medicare Other

## 2017-04-08 DIAGNOSIS — K648 Other hemorrhoids: Secondary | ICD-10-CM

## 2017-04-08 LAB — FECAL OCCULT BLOOD, IMMUNOCHEMICAL: Fecal Occult Bld: NEGATIVE

## 2018-02-23 ENCOUNTER — Other Ambulatory Visit: Payer: Self-pay | Admitting: Orthopedic Surgery

## 2018-03-03 ENCOUNTER — Encounter (HOSPITAL_BASED_OUTPATIENT_CLINIC_OR_DEPARTMENT_OTHER): Payer: Self-pay | Admitting: *Deleted

## 2018-03-07 ENCOUNTER — Other Ambulatory Visit: Payer: Self-pay

## 2018-03-07 ENCOUNTER — Encounter (HOSPITAL_BASED_OUTPATIENT_CLINIC_OR_DEPARTMENT_OTHER): Payer: Self-pay | Admitting: *Deleted

## 2018-03-07 ENCOUNTER — Ambulatory Visit (HOSPITAL_BASED_OUTPATIENT_CLINIC_OR_DEPARTMENT_OTHER)
Admission: RE | Admit: 2018-03-07 | Discharge: 2018-03-07 | Disposition: A | Discharge status: Home / Self Care | Payer: Medicare Other | Source: Ambulatory Visit | Attending: Orthopedic Surgery | Admitting: Orthopedic Surgery

## 2018-03-07 ENCOUNTER — Encounter (HOSPITAL_BASED_OUTPATIENT_CLINIC_OR_DEPARTMENT_OTHER)
Admission: RE | Disposition: A | Discharge status: Home / Self Care | Payer: Self-pay | Source: Ambulatory Visit | Attending: Orthopedic Surgery

## 2018-03-07 ENCOUNTER — Ambulatory Visit (HOSPITAL_BASED_OUTPATIENT_CLINIC_OR_DEPARTMENT_OTHER): Payer: Medicare Other | Admitting: Certified Registered"

## 2018-03-07 DIAGNOSIS — K219 Gastro-esophageal reflux disease without esophagitis: Secondary | ICD-10-CM | POA: Insufficient documentation

## 2018-03-07 DIAGNOSIS — K589 Irritable bowel syndrome without diarrhea: Secondary | ICD-10-CM | POA: Diagnosis not present

## 2018-03-07 DIAGNOSIS — Z79899 Other long term (current) drug therapy: Secondary | ICD-10-CM | POA: Insufficient documentation

## 2018-03-07 DIAGNOSIS — K449 Diaphragmatic hernia without obstruction or gangrene: Secondary | ICD-10-CM | POA: Diagnosis not present

## 2018-03-07 DIAGNOSIS — K649 Unspecified hemorrhoids: Secondary | ICD-10-CM | POA: Diagnosis not present

## 2018-03-07 DIAGNOSIS — L728 Other follicular cysts of the skin and subcutaneous tissue: Secondary | ICD-10-CM | POA: Insufficient documentation

## 2018-03-07 DIAGNOSIS — L910 Hypertrophic scar: Secondary | ICD-10-CM | POA: Diagnosis not present

## 2018-03-07 DIAGNOSIS — I493 Ventricular premature depolarization: Secondary | ICD-10-CM | POA: Diagnosis not present

## 2018-03-07 DIAGNOSIS — S61216A Laceration without foreign body of right little finger without damage to nail, initial encounter: Secondary | ICD-10-CM | POA: Diagnosis present

## 2018-03-07 HISTORY — PX: EXCISION MASS UPPER EXTREMETIES: SHX6704

## 2018-03-07 HISTORY — DX: Other specified postprocedural states: Z98.890

## 2018-03-07 HISTORY — DX: Nausea with vomiting, unspecified: R11.2

## 2018-03-07 SURGERY — EXCISION MASS UPPER EXTREMITIES
Anesthesia: Regional | Site: Hand | Laterality: Right

## 2018-03-07 MED ORDER — CHLORHEXIDINE GLUCONATE 4 % EX LIQD: 60.0000 mL | Freq: Once | CUTANEOUS | Status: DC

## 2018-03-07 MED ORDER — LACTATED RINGERS IV SOLN
INTRAVENOUS | Status: DC
  Administered 2018-03-07 (×2): via INTRAVENOUS

## 2018-03-07 MED ORDER — SCOPOLAMINE 1 MG/3DAYS TD PT72: 1.0000 | MEDICATED_PATCH | Freq: Once | TRANSDERMAL | Status: DC | PRN

## 2018-03-07 MED ORDER — FENTANYL CITRATE (PF) 100 MCG/2ML IJ SOLN
INTRAMUSCULAR | Status: AC
  Filled 2018-03-07: qty 2

## 2018-03-07 MED ORDER — HYDROCODONE-ACETAMINOPHEN 5-325 MG PO TABS: ORAL_TABLET | ORAL | 0 refills | Status: DC

## 2018-03-07 MED ORDER — BUPIVACAINE HCL (PF) 0.25 % IJ SOLN
INTRAMUSCULAR | Status: AC
  Filled 2018-03-07: qty 30

## 2018-03-07 MED ORDER — ONDANSETRON HCL 4 MG/2ML IJ SOLN
INTRAMUSCULAR | Status: AC
  Filled 2018-03-07: qty 2

## 2018-03-07 MED ORDER — METOCLOPRAMIDE HCL 5 MG/ML IJ SOLN: 10.0000 mg | Freq: Once | INTRAMUSCULAR | Status: DC | PRN

## 2018-03-07 MED ORDER — HYDROCODONE-ACETAMINOPHEN 5-325 MG PO TABS
1.0000 | ORAL_TABLET | Freq: Once | ORAL | Status: AC
  Administered 2018-03-07: 1 via ORAL

## 2018-03-07 MED ORDER — MIDAZOLAM HCL 2 MG/2ML IJ SOLN: 1.0000 mg | INTRAMUSCULAR | Status: DC | PRN

## 2018-03-07 MED ORDER — FENTANYL CITRATE (PF) 100 MCG/2ML IJ SOLN
INTRAMUSCULAR | Status: DC | PRN
  Administered 2018-03-07 (×2): 50 ug via INTRAVENOUS

## 2018-03-07 MED ORDER — MEPERIDINE HCL 25 MG/ML IJ SOLN: 6.2500 mg | INTRAMUSCULAR | Status: DC | PRN

## 2018-03-07 MED ORDER — CEFAZOLIN SODIUM-DEXTROSE 2-4 GM/100ML-% IV SOLN
2.0000 g | INTRAVENOUS | Status: AC
  Administered 2018-03-07: 2 g via INTRAVENOUS

## 2018-03-07 MED ORDER — ONDANSETRON HCL 4 MG/2ML IJ SOLN
INTRAMUSCULAR | Status: DC | PRN
  Administered 2018-03-07: 4 mg via INTRAVENOUS

## 2018-03-07 MED ORDER — 0.9 % SODIUM CHLORIDE (POUR BTL) OPTIME
TOPICAL | Status: DC | PRN
  Administered 2018-03-07: 1000 mL

## 2018-03-07 MED ORDER — MIDAZOLAM HCL 5 MG/5ML IJ SOLN
INTRAMUSCULAR | Status: DC | PRN
  Administered 2018-03-07: 2 mg via INTRAVENOUS

## 2018-03-07 MED ORDER — FENTANYL CITRATE (PF) 100 MCG/2ML IJ SOLN
25.0000 ug | INTRAMUSCULAR | Status: DC | PRN
  Administered 2018-03-07: 50 ug via INTRAVENOUS

## 2018-03-07 MED ORDER — BUPIVACAINE HCL (PF) 0.25 % IJ SOLN
INTRAMUSCULAR | Status: DC | PRN
  Administered 2018-03-07: 10 mL

## 2018-03-07 MED ORDER — FENTANYL CITRATE (PF) 100 MCG/2ML IJ SOLN: 50.0000 ug | INTRAMUSCULAR | Status: DC | PRN

## 2018-03-07 MED ORDER — MIDAZOLAM HCL 2 MG/2ML IJ SOLN
INTRAMUSCULAR | Status: AC
  Filled 2018-03-07: qty 2

## 2018-03-07 MED ORDER — HYDROCODONE-ACETAMINOPHEN 5-325 MG PO TABS
ORAL_TABLET | ORAL | Status: AC
  Filled 2018-03-07: qty 1

## 2018-03-07 MED ORDER — CEFAZOLIN SODIUM-DEXTROSE 2-4 GM/100ML-% IV SOLN
INTRAVENOUS | Status: AC
  Filled 2018-03-07: qty 100

## 2018-03-07 SURGICAL SUPPLY — 49 items
BANDAGE ACE 3X5.8 VEL STRL LF (GAUZE/BANDAGES/DRESSINGS) IMPLANT
BANDAGE COBAN STERILE 2 (GAUZE/BANDAGES/DRESSINGS) ×6 IMPLANT
BENZOIN TINCTURE PRP APPL 2/3 (GAUZE/BANDAGES/DRESSINGS) IMPLANT
BLADE MINI RND TIP GREEN BEAV (BLADE) IMPLANT
BLADE SURG 15 STRL LF DISP TIS (BLADE) ×2 IMPLANT
BLADE SURG 15 STRL SS (BLADE) ×4
BNDG COHESIVE 1X5 TAN STRL LF (GAUZE/BANDAGES/DRESSINGS) IMPLANT
BNDG CONFORM 2 STRL LF (GAUZE/BANDAGES/DRESSINGS) IMPLANT
BNDG ELASTIC 2X5.8 VLCR STR LF (GAUZE/BANDAGES/DRESSINGS) IMPLANT
BNDG ESMARK 4X9 LF (GAUZE/BANDAGES/DRESSINGS) IMPLANT
BNDG GAUZE 1X2.1 STRL (MISCELLANEOUS) IMPLANT
BNDG GAUZE ELAST 4 BULKY (GAUZE/BANDAGES/DRESSINGS) IMPLANT
BNDG PLASTER X FAST 3X3 WHT LF (CAST SUPPLIES) IMPLANT
CHLORAPREP W/TINT 26ML (MISCELLANEOUS) ×3 IMPLANT
CLOSURE WOUND 1/2 X4 (GAUZE/BANDAGES/DRESSINGS)
CORD BIPOLAR FORCEPS 12FT (ELECTRODE) ×3 IMPLANT
COVER BACK TABLE 60X90IN (DRAPES) ×3 IMPLANT
COVER MAYO STAND STRL (DRAPES) ×3 IMPLANT
CUFF TOURNIQUET SINGLE 18IN (TOURNIQUET CUFF) ×3 IMPLANT
DRAPE EXTREMITY T 121X128X90 (DRAPE) ×3 IMPLANT
DRAPE SURG 17X23 STRL (DRAPES) ×3 IMPLANT
GAUZE SPONGE 4X4 12PLY STRL (GAUZE/BANDAGES/DRESSINGS) ×6 IMPLANT
GAUZE XEROFORM 1X8 LF (GAUZE/BANDAGES/DRESSINGS) ×6 IMPLANT
GLOVE BIO SURGEON STRL SZ7.5 (GLOVE) ×3 IMPLANT
GLOVE BIOGEL PI IND STRL 8 (GLOVE) ×1 IMPLANT
GLOVE BIOGEL PI INDICATOR 8 (GLOVE) ×2
GOWN STRL REUS W/ TWL LRG LVL3 (GOWN DISPOSABLE) ×1 IMPLANT
GOWN STRL REUS W/TWL LRG LVL3 (GOWN DISPOSABLE) ×2
GOWN STRL REUS W/TWL XL LVL3 (GOWN DISPOSABLE) ×3 IMPLANT
NEEDLE HYPO 25X1 1.5 SAFETY (NEEDLE) ×3 IMPLANT
NS IRRIG 1000ML POUR BTL (IV SOLUTION) ×3 IMPLANT
PACK BASIN DAY SURGERY FS (CUSTOM PROCEDURE TRAY) ×3 IMPLANT
PAD CAST 3X4 CTTN HI CHSV (CAST SUPPLIES) IMPLANT
PAD CAST 4YDX4 CTTN HI CHSV (CAST SUPPLIES) IMPLANT
PADDING CAST ABS 4INX4YD NS (CAST SUPPLIES) ×2
PADDING CAST ABS COTTON 4X4 ST (CAST SUPPLIES) ×1 IMPLANT
PADDING CAST COTTON 3X4 STRL (CAST SUPPLIES)
PADDING CAST COTTON 4X4 STRL (CAST SUPPLIES)
STOCKINETTE 4X48 STRL (DRAPES) ×3 IMPLANT
STRIP CLOSURE SKIN 1/2X4 (GAUZE/BANDAGES/DRESSINGS) IMPLANT
SUT ETHILON 3 0 PS 1 (SUTURE) IMPLANT
SUT ETHILON 4 0 PS 2 18 (SUTURE) ×3 IMPLANT
SUT ETHILON 5 0 P 3 18 (SUTURE)
SUT NYLON ETHILON 5-0 P-3 1X18 (SUTURE) IMPLANT
SUT VIC AB 4-0 P2 18 (SUTURE) IMPLANT
SYR BULB 3OZ (MISCELLANEOUS) ×3 IMPLANT
SYR CONTROL 10ML LL (SYRINGE) ×3 IMPLANT
TOWEL OR 17X24 6PK STRL BLUE (TOWEL DISPOSABLE) ×6 IMPLANT
UNDERPAD 30X30 (UNDERPADS AND DIAPERS) ×3 IMPLANT

## 2018-03-07 NOTE — Op Note (Addendum)
[name] Simmons, [name] NO.:  [id number]  MEDICAL RECORD NO.:  [id number]  LOCATION:                                 FACILITY:  PHYSICIAN:  Betha Loa, MD             DATE OF BIRTH:  DATE OF PROCEDURE:  03/07/2018 DATE OF DISCHARGE:                              OPERATIVE REPORT   PREOPERATIVE DIAGNOSIS:  Right long finger annular ligament cyst and right small finger mass.  POSTOPERATIVE DIAGNOSIS:  Right long finger annular ligament cyst, radial digital nerve mass and right small finger scar tissue.  PROCEDURE:   1. Right long finger excision of annular ligament cyst  2. Right long finger removal of radial digital nerve mass measuring approximately 2 mm  3. Right small finger exploration of penetrating wound.  SURGEON:  Betha Loa, MD  ASSISTANT:  None.  ANESTHESIA:  Bier block with sedation.  IV FLUIDS:  Per Anesthesia flow sheet.  ESTIMATED BLOOD LOSS:  Minimal.  COMPLICATIONS:  None.  SPECIMENS:  Right long finger mass x2 to Pathology.  TOURNIQUET TIME:  Approximately 30 minutes.  DISPOSITION:  Stable to PACU.  INDICATIONS:  [name] Simmons is a 67 year old male who sustained a laceration to the right small finger several weeks ago.  He feels that there may be something remaining in here, though it is improving.  He has also noted a mass in the long finger that is bothersome to him.  He wishes to have this removed.  Risks, benefits, and alternatives of the surgery were discussed including the risk of blood loss; infection; damage to nerves, vessels, tendons, ligaments, bone; failure of the surgery; need for additional surgery; complications with wound healing; continued pain; recurrence of mass.  He voiced understanding of these risks and elected to proceed.  OPERATIVE COURSE:  After being identified preoperatively by myself, the patient and I agreed upon procedure and site of procedure.  Surgical site was marked.  The risks, benefits, and  alternatives of the surgery were reviewed and he wished to proceed.  Surgical consent had been signed.  He was given IV Ancef as preoperative antibiotic prophylaxis. He was transferred to the operating room, placed on the operating room table in supine position with the right upper extremity on arm board. Bier block anesthesia was induced by anesthesiologist.  The right upper extremity was prepped and draped in normal sterile orthopedic fashion. Surgical pause was performed between surgeons, Anesthesia, and operating room staff, and all were in agreement as to the patient, procedure, and site of the procedure.  Tourniquet at the proximal aspect of the forearm had been inflated for the Bier block.  Incision was made in a Brunner fashion at the proximal flexion crease of the long finger.  This was carried into subcutaneous tissues by spreading technique.  The cyst on the tendon sheath was easily identified.  It was approximately the size of a pea.  There was also noted to be a small lobular mass on the radial digital nerve.  This was carefully removed.  It came off from the nerve easily.  It was sent to Pathology for examination.  The mass on  the tendon sheath was also removed.  It was filled with clear gelatinous fluid.  It was coming from a rent in the distal aspect of the A2 pulley. It was approximately 1 to 2 mm from the end of the pulley.  The pulley was then released from the rent in the pulley to the distal aspect to try to prevent recurrence.  A small portion of the pulley was excised. The wound was copiously irrigated with sterile saline and closed with 4- 0 nylon in a horizontal mattress fashion.  The small finger was addressed.  A small incision was made just distal to the PIP joint in the area of the mass.  The area was explored.  The ulnar digital nerve and artery were identified and protected throughout the case.  There was a small engorged vein distally.  There was some  thickened what appeared to be scar tissue and some thickness to the skin in the area of the wound.  No foreign matter was noted.  No mass was noted.  The wound was copiously irrigated with sterile saline.  There was nothing to sent to Pathology.  The wound was closed with 4-0 nylon in a horizontal mattress fashion.  Digital blocks were performed with 0.25% plain Marcaine to aid in postoperative analgesia.  The wounds were then dressed with sterile Xeroform, 4x4s, and wrapped with a Coban dressing lightly.  Tourniquet was deflated at approximately 30 minutes.  Fingertips were all pink with brisk capillary refill after deflation of the tourniquet.  The operative drapes were broken down.  The patient was awoken from anesthesia safely. He was transferred back to stretcher and taken to PACU in stable condition.  I will see him back in the office in one week for postoperative followup.  We gave him Norco 5/325, 1 to 2 p.o. q.6 hours p.r.n. pain, dispensed #20.     Betha LoaKevin Aden Youngman, MD     KK/MEDQ  D:  03/07/2018  T:  03/07/2018  Job:  [id number]

## 2018-03-07 NOTE — Transfer of Care (Signed)
Immediate Anesthesia Transfer of Care Note  Patient: [name] PhenixJohnny G Darsey  Procedure(s) Performed: EXCISION MASS RIGHT LONG FINGER AND RIGHT SMALL FINGER (Right Hand)  Patient Location: PACU  Anesthesia Type:Bier block  Level of Consciousness: awake and patient cooperative  Airway & Oxygen Therapy: Patient Spontanous Breathing and Patient connected to face mask oxygen  Post-op Assessment: Report given to RN and Post -op Vital signs reviewed and stable  Post vital signs: Reviewed and stable  Last Vitals:  Vitals Value Taken Time  BP 146/89 03/07/2018  2:13 PM  Temp    Pulse 60 03/07/2018  2:15 PM  Resp 15 03/07/2018  2:15 PM  SpO2 95 % 03/07/2018  2:15 PM  Vitals shown include unvalidated device data.  Last Pain:  Vitals:   03/07/18 1218  PainSc: 0-No pain         Complications: No apparent anesthesia complications

## 2018-03-07 NOTE — Discharge Instructions (Addendum)

## 2018-03-07 NOTE — Brief Op Note (Signed)
03/07/2018  2:09 PM  PATIENT:  [name] Simmons  67 y.o. male  PRE-OPERATIVE DIAGNOSIS:  RIGHT LONG FINGER ANNULAR LIGAMENT CYST, RIGHT SMALL FINGER MASS R22.31  POST-OPERATIVE DIAGNOSIS:  RIGHT LONG FINGER ANNULAR LIGAMENT CYST, RIGHT SMALL FINGER MASS R22.31  PROCEDURE:  Procedure(s): EXCISION MASS RIGHT LONG FINGER AND RIGHT SMALL FINGER (Right)  SURGEON:  Surgeon(s) and Role:    * Betha LoaKuzma, Yuna Pizzolato, MD - Primary  PHYSICIAN ASSISTANT:   ASSISTANTS: none   ANESTHESIA:   Bier block with sedation  EBL:  4 mL   BLOOD ADMINISTERED:none  DRAINS: none   LOCAL MEDICATIONS USED:  MARCAINE     SPECIMEN:  Source of Specimen:  right long finger mass x 2  DISPOSITION OF SPECIMEN:  PATHOLOGY  COUNTS:  YES  TOURNIQUET:  ~30 minutes at 250 mmHg  DICTATION: .Other Dictation: Dictation Number [id number]  PLAN OF CARE: Discharge to home after PACU  PATIENT DISPOSITION:  PACU - hemodynamically stable.

## 2018-03-07 NOTE — Op Note (Signed)
[id number] 

## 2018-03-07 NOTE — Anesthesia Preprocedure Evaluation (Signed)
Anesthesia Evaluation  Patient identified by MRN, date of birth, ID band Patient awake    Reviewed: Allergy & Precautions, NPO status , Patient's Chart, lab work & pertinent test results  History of Anesthesia Complications (+) PONV  Airway Mallampati: II  TM Distance: >3 FB Neck ROM: Full    Dental no notable dental hx.    Pulmonary neg pulmonary ROS,    Pulmonary exam normal breath sounds clear to auscultation       Cardiovascular negative cardio ROS Normal cardiovascular exam Rhythm:Regular Rate:Normal     Neuro/Psych negative neurological ROS  negative psych ROS   GI/Hepatic Neg liver ROS, hiatal hernia, GERD  ,  Endo/Other  negative endocrine ROS  Renal/GU negative Renal ROS  negative genitourinary   Musculoskeletal negative musculoskeletal ROS (+)   Abdominal   Peds negative pediatric ROS (+)  Hematology negative hematology ROS (+)   Anesthesia Other Findings   Reproductive/Obstetrics negative OB ROS                             Anesthesia Physical Anesthesia Plan  ASA: II  Anesthesia Plan: Bier Block and Bier Block-LIDOCAINE ONLY   Post-op Pain Management:    Induction:   PONV Risk Score and Plan: 2 and Ondansetron  Airway Management Planned: Simple Face Mask  Additional Equipment:   Intra-op Plan:   Post-operative Plan:   Informed Consent: I have reviewed the patients History and Physical, chart, labs and discussed the procedure including the risks, benefits and alternatives for the proposed anesthesia with the patient or authorized representative who has indicated his/her understanding and acceptance.   Dental advisory given  Plan Discussed with: CRNA  Anesthesia Plan Comments: (H/o severe PONV and post of confusion.  IV regional preferred. )        Anesthesia Quick Evaluation

## 2018-03-07 NOTE — Anesthesia Procedure Notes (Signed)
Procedure Name: MAC Date/Time: 03/07/2018 1:29 PM Performed by: Marrianne Mood, CRNA Pre-anesthesia Checklist: Patient identified, Emergency Drugs available, Suction available, Patient being monitored and Timeout performed Patient Re-evaluated:Patient Re-evaluated prior to induction Oxygen Delivery Method: Simple face mask Preoxygenation: Pre-oxygenation with 100% oxygen

## 2018-03-07 NOTE — H&P (Signed)
[name] PhenixJohnny G Simmons is an 67 y.o. male.   Chief Complaint: right long and small finger masses HPI: 67 yo male with mass in right long finger at proximal flexion crease and mass in small finger near pip joint.  These are bothersome to him  And he wishes to have them removed.  Allergies: No Known Allergies  Past Medical History:  Diagnosis Date  . Colonic ulcer   . Diverticulosis   . GERD (gastroesophageal reflux disease)   . Hiatal hernia   . IBS (irritable bowel syndrome)   . Internal hemorrhoids   . PONV (postoperative nausea and vomiting)   . PVC (premature ventricular contraction)     Past Surgical History:  Procedure Laterality Date  . COLON SURGERY     Patient does not remember date of sx  . NASAL SEPTUM SURGERY    . TONSILLECTOMY      Family History: History reviewed. No pertinent family history.  Social History:   reports that he has never smoked. He has never used smokeless tobacco. He reports that he does not drink alcohol or use drugs.  Medications: Medications Prior to Admission  Medication Sig Dispense Refill  . esomeprazole (NEXIUM) 40 MG capsule Take 1 capsule (40 mg total) by mouth 2 (two) times daily. 60 capsule 11    No results found for this or any previous visit (from the past 48 hour(s)).  No results found.   A comprehensive review of systems was negative.  Blood pressure (!) 124/93, pulse 79, temperature 97.7 F (36.5 C), resp. rate (!) 118, height 6' (1.829 m), weight 88.1 kg (194 lb 3.2 oz), SpO2 99 %.  General appearance: alert, cooperative and appears stated age Head: Normocephalic, without obvious abnormality, atraumatic Neck: supple, symmetrical, trachea midline Cardio: regular rate and rhythm Resp: clear to auscultation bilaterally Extremities: Intact sensation and capillary refill all digits.  +epl/fpl/io.  No wounds.  Pulses: 2+ and symmetric Skin: Skin color, texture, turgor normal. No rashes or lesions Neurologic: Grossly  normal Incision/Wound: none  Assessment/Plan Right long and small finger masses.  Non operative and operative treatment options were discussed with the patient and patient wishes to proceed with operative treatment. Risks, benefits, and alternatives of surgery were discussed and the patient agrees with the plan of care.   Ryley Bachtel R 03/07/2018, 1:18 PM

## 2018-03-08 ENCOUNTER — Encounter (HOSPITAL_BASED_OUTPATIENT_CLINIC_OR_DEPARTMENT_OTHER): Payer: Self-pay | Admitting: Orthopedic Surgery

## 2018-03-08 NOTE — Anesthesia Postprocedure Evaluation (Signed)
Anesthesia Post Note  Patient: Arley PhenixJohnny G Stogdill  Procedure(s) Performed: EXCISION MASS RIGHT LONG FINGER AND RIGHT SMALL FINGER (Right Hand)     Patient location during evaluation: PACU Anesthesia Type: Bier Block Level of consciousness: awake and alert Pain management: pain level controlled Vital Signs Assessment: post-procedure vital signs reviewed and stable Respiratory status: spontaneous breathing, nonlabored ventilation, respiratory function stable and patient connected to nasal cannula oxygen Cardiovascular status: stable and blood pressure returned to baseline Postop Assessment: no apparent nausea or vomiting Anesthetic complications: no    Last Vitals:  Vitals:   03/07/18 1430 03/07/18 1445  BP: (!) 133/96 127/78  Pulse: 64 64  Resp: 18 15  Temp:    SpO2: 96% 95%    Last Pain:  Vitals:   03/08/18 0939  PainSc: 3                  Phillips Groutarignan, Damare Serano

## 2018-04-26 ENCOUNTER — Telehealth: Payer: Self-pay | Admitting: Gastroenterology

## 2018-04-26 MED ORDER — ESOMEPRAZOLE MAGNESIUM 40 MG PO CPDR: 40.0000 mg | DELAYED_RELEASE_CAPSULE | Freq: Two times a day (BID) | ORAL | 0 refills | Status: DC

## 2018-04-26 NOTE — Telephone Encounter (Signed)
Informed patient that we can send a refill to his pharmacy but he is due for his yearly follow-up visit. Patient scheduled appt for 04/28/18 at 11:15am. Prescription for Nexium sent to his pharmacy. Patient verbalized understanding.

## 2018-04-28 ENCOUNTER — Encounter: Payer: Self-pay | Admitting: Gastroenterology

## 2018-04-28 ENCOUNTER — Ambulatory Visit (INDEPENDENT_AMBULATORY_CARE_PROVIDER_SITE_OTHER): Payer: Medicare Other | Admitting: Gastroenterology

## 2018-04-28 VITALS — BP 110/70 | HR 72 | Ht 72.0 in | Wt 199.0 lb

## 2018-04-28 DIAGNOSIS — K219 Gastro-esophageal reflux disease without esophagitis: Secondary | ICD-10-CM

## 2018-04-28 DIAGNOSIS — Z1212 Encounter for screening for malignant neoplasm of rectum: Secondary | ICD-10-CM

## 2018-04-28 DIAGNOSIS — Z1211 Encounter for screening for malignant neoplasm of colon: Secondary | ICD-10-CM

## 2018-04-28 MED ORDER — ESOMEPRAZOLE MAGNESIUM 40 MG PO CPDR: 40.0000 mg | DELAYED_RELEASE_CAPSULE | Freq: Two times a day (BID) | ORAL | 11 refills | Status: DC

## 2018-04-28 NOTE — Patient Instructions (Signed)
We have sent the following medications to your pharmacy for you to pick up at your convenience: Nexium.   Call back if you decide you want a Colonoscopy, FIT testing, or Cologard.   Establish with a primary care physician.   Thank you for choosing me and [facility].  Venita Lick. Pleas Koch., MD., Clementeen Graham

## 2018-04-28 NOTE — Progress Notes (Signed)
    History of Present Illness: This is a 67 year old male returning for GERD management.  States his reflux symptoms are very well controlled on Nexium.  He has no gastrointestinal complaints.  He has previously declined colorectal cancer screening.  He does not have a PCP at this time.  Colonoscopy in October 2003 showed sigmoid colon diverticulosis, internal hemorrhoids and a solitary ulcer on the ileocecal valve.  Biopsy of the ulcer showed benign mucosa with focal ulceration.  EGD in November 2005 showed mild duodenitis and a small hiatal hernia.  Current Medications, Allergies, Past Medical History, Past Surgical History, Family History and Social History were reviewed in [employer] record.  Physical Exam: General: Well developed, well nourished, no acute distress Head: Normocephalic and atraumatic Eyes:  sclerae anicteric, EOMI Ears: Normal auditory acuity Mouth: No deformity or lesions Lungs: Clear throughout to auscultation Heart: Regular rate and rhythm; no murmurs, rubs or bruits Abdomen: Soft, non tender and non distended. No masses, hepatosplenomegaly or hernias noted. Normal Bowel sounds Rectal: Not done Musculoskeletal: Symmetrical with no gross deformities  Pulses:  Normal pulses noted Extremities: No clubbing, cyanosis, edema or deformities noted Neurological: Alert oriented x 4, grossly nonfocal Psychological:  Alert and cooperative. Normal mood and affect  Assessment and Recommendations:  1.  GERD.  Follow standard antireflux measures.  Continue Nexium 40 mg p.o. every morning. REV in 1 year.   2.  CRC screening average risk.  We discussed, and he again declined, CRC screening with colonoscopy, Cologuard or FIT.  I have asked him to further consider and to please call us back if he chooses to proceed.  3.  Primary care.  He has not had primary care and wellness exams for several years.  I strongly advised him to obtain a primary care provider and  have a full physical exam performed as soon as possible.

## 2019-02-28 ENCOUNTER — Telehealth: Payer: Self-pay | Admitting: Gastroenterology

## 2019-02-28 NOTE — Telephone Encounter (Signed)
Patient states that Nexium needs a PA. Informed patient I will contact his insurance company for PA and will call him with there response.

## 2019-02-28 NOTE — Telephone Encounter (Signed)
Left a message for patient to return my call. 

## 2019-02-28 NOTE — Telephone Encounter (Signed)
Pt informed that insurance no longer covers Nexium 40 mg and prefers can alt drug but did not mention drug to pt: [phone].  Please also call pt back to update.

## 2019-03-01 NOTE — Telephone Encounter (Signed)
Spoke with [facility] and they state generic Nexium is on patient's formulary and does not need a PA. They cancelled the PA for Nexium brand only. Patient informed to call pharmacy and have them run as generic Nexium and not brand only and it should be covered according to [facility]. Patient verbalized understanding.

## 2019-03-02 NOTE — Telephone Encounter (Signed)
Patient called back and states he misunderstood me before and he is requesting the brand name Nexium and not the generic. Informed patient that I will start another PA for brand name Nexium.

## 2019-03-02 NOTE — Telephone Encounter (Addendum)
Called for PA for brand name Nexium 40 mg twice daily. [facility] approved Nexium brand name. PA #[id number]. Patient notified of approval. Patient notified.

## 2020-03-08 DIAGNOSIS — J310 Chronic rhinitis: Secondary | ICD-10-CM | POA: Insufficient documentation

## 2020-03-08 DIAGNOSIS — J0141 Acute recurrent pansinusitis: Secondary | ICD-10-CM | POA: Insufficient documentation

## 2020-03-08 DIAGNOSIS — H9190 Unspecified hearing loss, unspecified ear: Secondary | ICD-10-CM | POA: Insufficient documentation

## 2021-02-24 ENCOUNTER — Other Ambulatory Visit: Payer: Self-pay

## 2021-02-24 ENCOUNTER — Encounter: Payer: Self-pay | Admitting: Otolaryngology

## 2021-02-25 ENCOUNTER — Other Ambulatory Visit
Admission: RE | Admit: 2021-02-25 | Discharge: 2021-02-25 | Disposition: A | Discharge status: Home / Self Care | Payer: Medicare Other | Source: Ambulatory Visit | Attending: Otolaryngology | Admitting: Otolaryngology

## 2021-02-25 DIAGNOSIS — Z01812 Encounter for preprocedural laboratory examination: Secondary | ICD-10-CM | POA: Insufficient documentation

## 2021-02-25 DIAGNOSIS — Z20822 Contact with and (suspected) exposure to covid-19: Secondary | ICD-10-CM | POA: Diagnosis not present

## 2021-02-25 LAB — SARS CORONAVIRUS 2 (TAT 6-24 HRS): SARS Coronavirus 2: NEGATIVE

## 2021-02-27 ENCOUNTER — Encounter
Admission: RE | Disposition: A | Discharge status: Home / Self Care | Payer: Self-pay | Source: Home / Self Care | Attending: Otolaryngology

## 2021-02-27 ENCOUNTER — Ambulatory Visit: Payer: Medicare Other | Admitting: Anesthesiology

## 2021-02-27 ENCOUNTER — Ambulatory Visit
Admission: RE | Admit: 2021-02-27 | Discharge: 2021-02-27 | Disposition: A | Discharge status: Home / Self Care | Payer: Medicare Other | Attending: Otolaryngology | Admitting: Otolaryngology

## 2021-02-27 ENCOUNTER — Encounter: Payer: Self-pay | Admitting: Otolaryngology

## 2021-02-27 ENCOUNTER — Other Ambulatory Visit: Payer: Self-pay

## 2021-02-27 DIAGNOSIS — J343 Hypertrophy of nasal turbinates: Secondary | ICD-10-CM | POA: Diagnosis not present

## 2021-02-27 DIAGNOSIS — Z79899 Other long term (current) drug therapy: Secondary | ICD-10-CM | POA: Insufficient documentation

## 2021-02-27 DIAGNOSIS — J320 Chronic maxillary sinusitis: Secondary | ICD-10-CM | POA: Diagnosis not present

## 2021-02-27 DIAGNOSIS — J342 Deviated nasal septum: Secondary | ICD-10-CM | POA: Insufficient documentation

## 2021-02-27 HISTORY — DX: Presence of external hearing-aid: Z97.4

## 2021-02-27 HISTORY — PX: IMAGE GUIDED SINUS SURGERY: SHX6570

## 2021-02-27 HISTORY — PX: TURBINATE REDUCTION: SHX6157

## 2021-02-27 HISTORY — PX: MAXILLARY ANTROSTOMY: SHX2003

## 2021-02-27 SURGERY — SINUS SURGERY, WITH IMAGING GUIDANCE
Anesthesia: General | Site: Nose

## 2021-02-27 MED ORDER — EPHEDRINE SULFATE 50 MG/ML IJ SOLN
INTRAMUSCULAR | Status: DC | PRN
  Administered 2021-02-27: 5 mg via INTRAVENOUS
  Administered 2021-02-27 (×2): 10 mg via INTRAVENOUS
  Administered 2021-02-27: 5 mg via INTRAVENOUS
  Administered 2021-02-27 (×2): 10 mg via INTRAVENOUS

## 2021-02-27 MED ORDER — CEFAZOLIN SODIUM 1 G IJ SOLR
2000.0000 mg | Freq: Once | INTRAMUSCULAR | Status: AC
  Administered 2021-02-27: 2000 mg via INTRAVENOUS

## 2021-02-27 MED ORDER — LIDOCAINE-EPINEPHRINE 1 %-1:100000 IJ SOLN
INTRAMUSCULAR | Status: DC | PRN
  Administered 2021-02-27: 7 mL

## 2021-02-27 MED ORDER — OXYCODONE HCL 5 MG PO TABS
5.0000 mg | ORAL_TABLET | Freq: Once | ORAL | Status: AC | PRN
  Administered 2021-02-27: 5 mg via ORAL

## 2021-02-27 MED ORDER — ACETAMINOPHEN 10 MG/ML IV SOLN
1000.0000 mg | Freq: Once | INTRAVENOUS | Status: AC
  Administered 2021-02-27: 1000 mg via INTRAVENOUS

## 2021-02-27 MED ORDER — ONDANSETRON HCL 4 MG/2ML IJ SOLN: 4.0000 mg | Freq: Once | INTRAMUSCULAR | Status: DC | PRN

## 2021-02-27 MED ORDER — PREDNISONE 10 MG PO TABS: ORAL_TABLET | ORAL | 0 refills | Status: DC

## 2021-02-27 MED ORDER — OXYCODONE-ACETAMINOPHEN 7.5-325 MG PO TABS: 1.0000 | ORAL_TABLET | ORAL | 0 refills | Status: AC | PRN

## 2021-02-27 MED ORDER — ACETAMINOPHEN 160 MG/5ML PO SOLN: 325.0000 mg | ORAL | Status: DC | PRN

## 2021-02-27 MED ORDER — LIDOCAINE HCL (CARDIAC) PF 100 MG/5ML IV SOSY
PREFILLED_SYRINGE | INTRAVENOUS | Status: DC | PRN
  Administered 2021-02-27: 40 mg via INTRAVENOUS

## 2021-02-27 MED ORDER — SUCCINYLCHOLINE CHLORIDE 20 MG/ML IJ SOLN
INTRAMUSCULAR | Status: DC | PRN
  Administered 2021-02-27: 100 mg via INTRAVENOUS

## 2021-02-27 MED ORDER — PROPOFOL 10 MG/ML IV BOLUS
INTRAVENOUS | Status: DC | PRN
  Administered 2021-02-27: 30 mg via INTRAVENOUS
  Administered 2021-02-27: 120 mg via INTRAVENOUS
  Administered 2021-02-27: 20 mg via INTRAVENOUS

## 2021-02-27 MED ORDER — SCOPOLAMINE 1 MG/3DAYS TD PT72
1.0000 | MEDICATED_PATCH | Freq: Once | TRANSDERMAL | Status: DC
  Administered 2021-02-27: 1.5 mg via TRANSDERMAL

## 2021-02-27 MED ORDER — LACTATED RINGERS IV SOLN: INTRAVENOUS | Status: DC

## 2021-02-27 MED ORDER — MIDAZOLAM HCL 5 MG/5ML IJ SOLN
INTRAMUSCULAR | Status: DC | PRN
  Administered 2021-02-27: 1 mg via INTRAVENOUS

## 2021-02-27 MED ORDER — DEXAMETHASONE SODIUM PHOSPHATE 4 MG/ML IJ SOLN
INTRAMUSCULAR | Status: DC | PRN
  Administered 2021-02-27: 8 mg via INTRAVENOUS

## 2021-02-27 MED ORDER — ONDANSETRON HCL 4 MG/2ML IJ SOLN
INTRAMUSCULAR | Status: DC | PRN
Start: 2021-02-27 — End: 2021-02-27
  Administered 2021-02-27 (×2): 4 mg via INTRAVENOUS

## 2021-02-27 MED ORDER — ACETAMINOPHEN 325 MG PO TABS: 325.0000 mg | ORAL_TABLET | ORAL | Status: DC | PRN

## 2021-02-27 MED ORDER — CEPHALEXIN 500 MG PO CAPS: 500.0000 mg | ORAL_CAPSULE | Freq: Two times a day (BID) | ORAL | 0 refills | Status: DC

## 2021-02-27 MED ORDER — OXYMETAZOLINE HCL 0.05 % NA SOLN
2.0000 | Freq: Once | NASAL | Status: AC
  Administered 2021-02-27: 2 via NASAL

## 2021-02-27 MED ORDER — GLYCOPYRROLATE 0.2 MG/ML IJ SOLN
INTRAMUSCULAR | Status: DC | PRN
  Administered 2021-02-27: .1 mg via INTRAVENOUS

## 2021-02-27 MED ORDER — FENTANYL CITRATE (PF) 100 MCG/2ML IJ SOLN
INTRAMUSCULAR | Status: DC | PRN
  Administered 2021-02-27: 50 ug via INTRAVENOUS
  Administered 2021-02-27 (×3): 25 ug via INTRAVENOUS

## 2021-02-27 MED ORDER — PHENYLEPHRINE HCL 0.5 % NA SOLN
NASAL | Status: DC | PRN
  Administered 2021-02-27: 15 mL via TOPICAL

## 2021-02-27 SURGICAL SUPPLY — 40 items
BATTERY INSTRU NAVIGATION (MISCELLANEOUS) ×9 IMPLANT
BTRY SRG DRVR LF (MISCELLANEOUS) ×6
CANISTER SUCT 1200ML W/VALVE (MISCELLANEOUS) ×3 IMPLANT
CATH IV 18X1 1/4 SAFELET (CATHETERS) ×3 IMPLANT
COAGULATOR SUCT 8FR VV (MISCELLANEOUS) ×3 IMPLANT
CUP MEDICINE 2OZ PLAST GRAD ST (MISCELLANEOUS) ×3 IMPLANT
ELECT REM PT RETURN 9FT ADLT (ELECTROSURGICAL) ×3
ELECTRODE REM PT RTRN 9FT ADLT (ELECTROSURGICAL) ×2 IMPLANT
GLOVE PI ULTRA LF STRL 7.5 (GLOVE) ×4 IMPLANT
GLOVE PI ULTRA NON LATEX 7.5 (GLOVE) ×2
GOWN STRL REUS W/ TWL LRG LVL3 (GOWN DISPOSABLE) ×2 IMPLANT
GOWN STRL REUS W/TWL LRG LVL3 (GOWN DISPOSABLE) ×3
IV CATH 18X1 1/4 SAFELET (CATHETERS) ×2
IV NS 500ML (IV SOLUTION) ×3
IV NS 500ML BAXH (IV SOLUTION) ×2 IMPLANT
KIT TURNOVER KIT A (KITS) ×3 IMPLANT
NDL ANESTHESIA 27G X 3.5 (NEEDLE) ×2 IMPLANT
NDL HYPO 27GX1-1/4 (NEEDLE) ×2 IMPLANT
NEEDLE ANESTHESIA  27G X 3.5 (NEEDLE) ×3
NEEDLE ANESTHESIA 27G X 3.5 (NEEDLE) ×2 IMPLANT
NEEDLE HYPO 27GX1-1/4 (NEEDLE) ×3 IMPLANT
NS IRRIG 500ML POUR BTL (IV SOLUTION) ×3 IMPLANT
PACK ENT CUSTOM (PACKS) ×3 IMPLANT
PACKING NASAL EPIS 4X2.4 XEROG (MISCELLANEOUS) ×6 IMPLANT
PATTIES SURGICAL .5 X3 (DISPOSABLE) ×3 IMPLANT
SHAVER DIEGO BLD STD TYPE A (BLADE) ×3 IMPLANT
SOL ANTI-FOG 6CC FOG-OUT (MISCELLANEOUS) ×2 IMPLANT
SOL FOG-OUT ANTI-FOG 6CC (MISCELLANEOUS) ×1
SPLINT NASAL SEPTAL BLV .50 ST (MISCELLANEOUS) ×3 IMPLANT
STRAP BODY AND KNEE 60X3 (MISCELLANEOUS) ×6 IMPLANT
SUT CHROMIC 3-0 (SUTURE) ×3
SUT CHROMIC 3-0 KS 27XMFL CR (SUTURE) ×2
SUT ETHILON 3-0 KS 30 BLK (SUTURE) ×3 IMPLANT
SUT PLAIN GUT 4-0 (SUTURE) ×3 IMPLANT
SUTURE CHRMC 3-0 KS 27XMFL CR (SUTURE) IMPLANT
SYR 3ML LL SCALE MARK (SYRINGE) ×3 IMPLANT
TOWEL OR 17X26 4PK STRL BLUE (TOWEL DISPOSABLE) ×3 IMPLANT
TRACKER CRANIALMASK (MASK) ×3 IMPLANT
TUBING DECLOG MULTIDEBRIDER (TUBING) ×3 IMPLANT
WATER STERILE IRR 250ML POUR (IV SOLUTION) ×3 IMPLANT

## 2021-02-27 NOTE — Op Note (Signed)
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  His ethmoid plate buckled somewhat to the left and part of the vomer buckled towards the right.  He had previous left maxillary antrostomy done however it did not opened up the natural ostium so there was a bridge of tissue left that was leading to recycling and chronic sinus infection.  The right maxillary antrum was blocked.  The left middle turbinate had been partially trimmed previously and was bulging some anteriorly  Procedure: The patient was brought to the operating room and was placed in the supine position.  He was given general anesthesia by oral endotracheal intubation.  His nose was prepped using 7 mL of 1% Xylocaine with epi 1: 100,000 for infiltration of the nasal septum as well as the uncinate process on both sides and the anterior and inferior roots of the middle turbinates.  Cottonoid pledgets soaked in phenylephrine and Xylocaine were then used for packing the nose on both sides.  The image guided system was brought in and the template was applied to the face.  The CT scan was downloaded to the system.  The template was registered to the system and there was point 8 mm of variance.  The suction instruments were then registered the system as well and these were checked for alignment once the cotton pledgets were removed, which showed good alignment.  The patient was prepped and draped in a sterile fashion.  Using the image guided system and the 0  and 30 degrees scopes the airway was visualized on both sides.  The left side showed the partially open maxillary antrum with a large bridge of tissue here and the natural ostium more anterior to this.  Using a 90 degree forceps and a 7 degree scope that bridge of tissue was removed.  There was further bony ledge inside the superior wall of the maxillary sinus and using 90 degree forceps and backbiting forceps this bony ledge was trimmed.  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  The maxillary sinus opening was now clear and open and the 70 degree scope was see the entirety of the sinus with no signs of inflammation at the bases currently.  The uncinate was previously removed and there is no significant disease in the ethmoid sinuses.  Frontal sinus duct was evident and easily opened as there had been some partial ethmoidectomy done on this side.  The middle turbinate had a notch in it from some previous trimming.  The middle turbinate was remodeled using the through biting forceps and the microdebrider.  This created a smooth anterior inferior border and thinned it slightly to create more room into the middle meatus.  Electrocautery was used along its edge to control bleeding.  A cotton pledget was then placed here to help control bleeding while the right side was addressed.  With the 0 and 30 degree scopes the right side was visualized and the middle turbinate was infractured.  The uncinate process was incised  with a side biter and the entire uncinate process was removed.  This showed the natural ostium of the maxillary sinus.  The ostium was opened more posteriorly and inferiorly.  There was also evidence of thickened membranes and a Haller cell at the superior opening maxillary sinus and this was trimmed from inside the maxillary sinus.  The 70 degree scope was used then to visualize the rest the sinus which now appear to be open and did not have the thick mucus  and bubbles that was visualized on the CT scan.  The antral opening was clear and smooth.  At the front end of the uncinate process some bone was removed to open up the agar nasi cell more.  Right adjacent to that see the opening of the frontal sinus duct and this was not inflamed or blocked.  The inferior turbinates were enlarged on both sides.  They were trimmed and cauterized some along its anterior inferior borders and then the remaining portion were outfractured on both sides to create better airway here.  The septum was visualized and had slight bowing of the vomer on the right side and the ethmoid plate was bowed to the left side.  The septum had previously had some surgery and the rest of it was relatively straight.  I placed a [place] elevator in the right side of the nose and fractured the right vomer inward some.  This straightened out the inferior portion of the septum better.  I then used a large nasal fracture clamp to fracture the ethmoid plate superiorly so it would slide back towards the middle.  This opened up his airway some on both sides.  I did not place any nasal splints or any packing in the nose.  Cotton pledgets were removed and the maxillary antrums were revisited.  There is no sign of significant bleeding here and the airways were wide open and clear.  I placed some xerogel into the middle meatus on both sides to help hold the middle turbinate medialized.  I put some xerogel over the middle turbinate on the left side to make sure it was covered to help it to heal.  Xerogel was just placed in the middle meatus and over the maxillary antral opening on the right side.  Patient tolerated procedure well.  He was awakened taken to the recovery room in satisfactory condition.  Dispo:   To PACU to be discharged home  Plan: To follow-up in the office in 5 to 7 days for evaluation of his nose and sinuses.  He will start some saline flushes tomorrow.  He will keep his head elevated and rest  for the next 4 days and then can slowly increase his activities.  Call the office this afternoon or tomorrow if he has any questions.  [name] Sessions Juengel  02/27/2021 8:55 AM

## 2021-02-27 NOTE — Anesthesia Procedure Notes (Signed)
Procedure Name: Intubation Date/Time: 02/27/2021 7:39 AM Performed by: Jimmy Picket, CRNA Pre-anesthesia Checklist: Patient identified, Emergency Drugs available, Suction available, Patient being monitored and Timeout performed Patient Re-evaluated:Patient Re-evaluated prior to induction Oxygen Delivery Method: Circle system utilized Preoxygenation: Pre-oxygenation with 100% oxygen Induction Type: IV induction Ventilation: Mask ventilation without difficulty Laryngoscope Size: Miller and 3 Grade View: Grade I Tube type: Oral Rae Tube size: 7.5 mm Number of attempts: 1 Airway Equipment and Method: Stylet Placement Confirmation: ETT inserted through vocal cords under direct vision,  positive ETCO2 and breath sounds checked- equal and bilateral Tube secured with: Tape Dental Injury: Teeth and Oropharynx as per pre-operative assessment  Comments: Small laceration noted to L upper lip after laryngoscopy.

## 2021-02-27 NOTE — Anesthesia Preprocedure Evaluation (Addendum)
Anesthesia Evaluation  Patient identified by MRN, date of birth, ID band Patient awake    Reviewed: Allergy & Precautions, NPO status   History of Anesthesia Complications (+) PONV and history of anesthetic complications  Airway Mallampati: II  TM Distance: >3 FB     Dental   Pulmonary neg pulmonary ROS,    Pulmonary exam normal        Cardiovascular negative cardio ROS   Rhythm:Regular Rate:Normal     Neuro/Psych    GI/Hepatic hiatal hernia, PUD, GERD  ,  Endo/Other    Renal/GU      Musculoskeletal   Abdominal   Peds  Hematology   Anesthesia Other Findings   Reproductive/Obstetrics                            Anesthesia Physical Anesthesia Plan  ASA: II  Anesthesia Plan: General   Post-op Pain Management:    Induction: Intravenous  PONV Risk Score and Plan: Ondansetron, Dexamethasone, Midazolam and Treatment may vary due to age or medical condition  Airway Management Planned: Oral ETT  Additional Equipment:   Intra-op Plan:   Post-operative Plan:   Informed Consent: I have reviewed the patients History and Physical, chart, labs and discussed the procedure including the risks, benefits and alternatives for the proposed anesthesia with the patient or authorized representative who has indicated his/her understanding and acceptance.     Dental advisory given  Plan Discussed with: CRNA  Anesthesia Plan Comments:         Anesthesia Quick Evaluation

## 2021-02-27 NOTE — Transfer of Care (Signed)
Immediate Anesthesia Transfer of Care Note  Patient: [name] Simmons  Procedure(s) Performed: IMAGE GUIDED SINUS SURGERY (N/A Nose) TURBINATE REDUCTION (Bilateral Nose) MAXILLARY ANTROSTOMY WITH TISSUE REMOVAL (Bilateral Nose)  Patient Location: PACU  Anesthesia Type: General  Level of Consciousness: awake, alert  and patient cooperative  Airway and Oxygen Therapy: Patient Spontanous Breathing and Patient connected to supplemental oxygen  Post-op Assessment: Post-op Vital signs reviewed, Patient's Cardiovascular Status Stable, Respiratory Function Stable, Patent Airway and No signs of Nausea or vomiting  Post-op Vital Signs: Reviewed and stable  Complications: No complications documented.

## 2021-02-27 NOTE — H&P (Signed)
H&P has been reviewed and patient reevaluated, no changes necessary. To be downloaded later.  

## 2021-02-27 NOTE — Anesthesia Postprocedure Evaluation (Addendum)
Anesthesia Post Note  Patient: [name] Simmons  Procedure(s) Performed: IMAGE GUIDED SINUS SURGERY (N/A Nose) TURBINATE REDUCTION (Bilateral Nose) MAXILLARY ANTROSTOMY WITH TISSUE REMOVAL (Bilateral Nose)     Patient location during evaluation: PACU Anesthesia Type: General Level of consciousness: awake Pain management: pain level controlled Vital Signs Assessment: post-procedure vital signs reviewed and stable Respiratory status: respiratory function stable Cardiovascular status: stable Postop Assessment: no signs of nausea or vomiting Anesthetic complications: no   No complications documented.   Clear instructions given to patient (preoperatively) and caregiver to remove scopolamine patch this afternoon/evening.  Jola Babinski

## 2021-02-27 NOTE — Addendum Note (Signed)
Addendum  created 02/27/21 1004 by Jola Babinski, MD   Order list changed, Order sets accessed, Pharmacy for encounter modified

## 2021-02-27 NOTE — Discharge Instructions (Signed)
[facility] ENDOSCOPIC SINUS SURGERY [facility]  What is Functional Endoscopic Sinus Surgery?  The Surgery involves making the natural openings of the sinuses larger by removing the bony partitions that separate the sinuses from the nasal cavity.  The natural sinus lining is preserved as much as possible to allow the sinuses to resume normal function after the surgery.  In some patients nasal polyps (excessively swollen lining of the sinuses) may be removed to relieve obstruction of the sinus openings.  The surgery is performed through the nose using lighted scopes, which eliminates the need for incisions on the face.  A septoplasty is a different procedure which is sometimes performed with sinus surgery.  It involves straightening the boy partition that separates the two sides of your nose.  A crooked or deviated septum may need repair if is obstructing the sinuses or nasal airflow.  Turbinate reduction is also often performed during sinus surgery.  The turbinates are bony proturberances from the side walls of the nose which swell and can obstruct the nose in patients with sinus and allergy problems.  Their size can be surgically reduced to help relieve nasal obstruction.  What Can Sinus Surgery Do For Me?  Sinus surgery can reduce the frequency of sinus infections requiring antibiotic treatment.  This can provide improvement in nasal congestion, post-nasal drainage, facial pressure and nasal obstruction.  Surgery will NOT prevent you from ever having an infection again, so it usually only for patients who get infections 4 or more times yearly requiring antibiotics, or for infections that do not clear with antibiotics.  It will not cure nasal allergies, so patients with allergies may still require medication to treat their allergies after surgery. Surgery may improve headaches related to sinusitis, however, some people will continue to  require medication to control sinus headaches related to allergies.  Surgery will do nothing for other forms of headache (migraine, tension or cluster).  What Are the Risks of Endoscopic Sinus Surgery?  Current techniques allow surgery to be performed safely with little risk, however, there are rare complications that patients should be aware of.  Because the sinuses are located around the eyes, there is risk of eye injury, including blindness, though again, this would be quite rare. This is usually a result of bleeding behind the eye during surgery, which puts the vision oat risk, though there are treatments to protect the vision and prevent permanent disrupted by surgery causing a leak of the spinal fluid that surrounds the brain.  More serious complications would include bleeding inside the brain cavity or damage to the brain.  Again, all of these complications are uncommon, and spinal fluid leaks can be safely managed surgically if they occur.  The most common complication of sinus surgery is bleeding from the nose, which may require packing or cauterization of the nose.  Continued sinus have polyps may experience recurrence of the polyps requiring revision surgery.  Alterations of sense of smell or injury to the tear ducts are also rare complications.   What is the Surgery Like, and what is the Recovery?  The Surgery usually takes a couple of hours to perform, and is usually performed under a general anesthetic (completely asleep).  Patients are usually discharged home after a couple of hours.  Sometimes during surgery it is necessary to pack the nose to control bleeding, and the packing is left in place for 24 - 48 hours, and removed by your surgeon.    If a septoplasty was performed during the procedure, there is often a splint placed which must be removed after 5-7 days.   Discomfort: Pain is usually mild to moderate, and can be controlled by prescription pain medication or acetaminophen (Tylenol).   Aspirin, Ibuprofen (Advil, Motrin), or Naprosyn (Aleve) should be avoided, as they can cause increased bleeding.  Most patients feel sinus pressure like they have a bad head cold for several days.  Sleeping with your head elevated can help reduce swelling and facial pressure, as can ice packs over the face.  A humidifier may be helpful to keep the mucous and blood from drying in the nose.   Diet: There are no specific diet restrictions, however, you should generally start with clear liquids and a light diet of bland foods because the anesthetic can cause some nausea.  Advance your diet depending on how your stomach feels.  Taking your pain medication with food will often help reduce stomach upset which pain medications can cause.  Nasal Saline Irrigation: It is important to remove blood clots and dried mucous from the nose as it is healing.  This is done by having you irrigate the nose at least 3 - 4 times daily with a salt water solution.  We recommend using NeilMed Sinus Rinse (available at the drug store).  Fill the squeeze bottle with the solution, bend over a sink, and insert the tip of the squeeze bottle into the nose  of an inch.  Point the tip of the squeeze bottle towards the inside corner of the eye on the same side your irrigating.  Squeeze the bottle and gently irrigate the nose.  If you bend forward as you do this, most of the fluid will flow back out of the nose, instead of down your throat.   The solution should be warm, near body temperature, when you irrigate.   Each time you irrigate, you should use a full squeeze bottle.   Note that if you are instructed to use Nasal Steroid Sprays at any time after your surgery, irrigate with saline BEFORE using the steroid spray, so you do not wash it all out of the nose. Another product, Nasal Saline Gel (such as AYR Nasal Saline Gel) can be applied in each nostril 3 - 4 times daily to moisture the nose and reduce scabbing or crusting.  Bleeding:   Bloody drainage from the nose can be expected for several days, and patients are instructed to irrigate their nose frequently with salt water to help remove mucous and blood clots.  The drainage may be dark red or brown, though some fresh blood may be seen intermittently, especially after irrigation.  Do not blow you nose, as bleeding may occur. If you must sneeze, keep your mouth open to allow air to escape through your mouth.  If heavy bleeding occurs: Irrigate the nose with saline to rinse out clots, then spray the nose 3 - 4 times with Afrin Nasal Decongestant Spray.  The spray will constrict the blood vessels to slow bleeding.  Pinch the lower half of your nose shut to apply pressure, and lay down with your head elevated.  Ice packs over the nose may help as well. If bleeding persists despite these measures, you should notify your doctor.  Do not use the Afrin routinely to control nasal congestion after surgery, as it can result in worsening congestion and may affect healing.     Activity: Return to work varies among patients. Most patients will be   out of work at least 5 - 7 days to recover.  Patient may return to work after they are off of narcotic pain medication, and feeling well enough to perform the functions of their job.  Patients must avoid heavy lifting (over 10 pounds) or strenuous physical for 2 weeks after surgery, so your employer may need to assign you to light duty, or keep you out of work longer if light duty is not possible.  NOTE: you should not drive, operate dangerous machinery, do any mentally demanding tasks or make any important legal or financial decisions while on narcotic pain medication and recovering from the general anesthetic.    Call Your Doctor Immediately if You Have Any of the Following: 1. Bleeding that you cannot control with the above measures 2. Loss of vision, double vision, bulging of the eye or black eyes. 3. Fever over 101 degrees 4. Neck stiffness with  severe headache, fever, nausea and change in mental state. You are always encourage to call anytime with concerns, however, please call with requests for pain medication refills during office hours.  Office Endoscopy: During follow-up visits your doctor will remove any packing or splints that may have been placed and evaluate and clean your sinuses endoscopically.  Topical anesthetic will be used to make this as comfortable as possible, though you may want to take your pain medication prior to the visit.  How often this will need to be done varies from patient to patient.  After complete recovery from the surgery, you may need follow-up endoscopy from time to time, particularly if there is concern of recurrent infection or nasal polyps.  General Anesthesia, Adult, Care After This sheet gives you information about how to care for yourself after your procedure. Your health care provider may also give you more specific instructions. If you have problems or questions, contact your health care provider. What can I expect after the procedure? After the procedure, the following side effects are common:  Pain or discomfort at the IV site.  Nausea.  Vomiting.  Sore throat.  Trouble concentrating.  Feeling cold or chills.  Feeling weak or tired.  Sleepiness and fatigue.  Soreness and body aches. These side effects can affect parts of the body that were not involved in surgery. Follow these instructions at home: For the time period you were told by your health care provider:  Rest.  Do not participate in activities where you could fall or become injured.  Do not drive or use machinery.  Do not drink alcohol.  Do not take sleeping pills or medicines that cause drowsiness.  Do not make important decisions or sign legal documents.  Do not take care of children on your own.   Eating and drinking  Follow any instructions from your health care provider about eating or drinking  restrictions.  When you feel hungry, start by eating small amounts of foods that are soft and easy to digest (bland), such as toast. Gradually return to your regular diet.  Drink enough fluid to keep your urine pale yellow.  If you vomit, rehydrate by drinking water, juice, or clear broth. General instructions  If you have sleep apnea, surgery and certain medicines can increase your risk for breathing problems. Follow instructions from your health care provider about wearing your sleep device: ? Anytime you are sleeping, including during daytime naps. ? While taking prescription pain medicines, sleeping medicines, or medicines that make you drowsy.  Have a responsible adult stay with you for the   time you are told. It is important to have someone help care for you until you are awake and alert.  Return to your normal activities as told by your health care provider. Ask your health care provider what activities are safe for you.  Take over-the-counter and prescription medicines only as told by your health care provider.  If you smoke, do not smoke without supervision.  Keep all follow-up visits as told by your health care provider. This is important. Contact a health care provider if:  You have nausea or vomiting that does not get better with medicine.  You cannot eat or drink without vomiting.  You have pain that does not get better with medicine.  You are unable to pass urine.  You develop a skin rash.  You have a fever.  You have redness around your IV site that gets worse. Get help right away if:  You have difficulty breathing.  You have chest pain.  You have blood in your urine or stool, or you vomit blood. Summary  After the procedure, it is common to have a sore throat or nausea. It is also common to feel tired.  Have a responsible adult stay with you for the time you are told. It is important to have someone help care for you until you are awake and  alert.  When you feel hungry, start by eating small amounts of foods that are soft and easy to digest (bland), such as toast. Gradually return to your regular diet.  Drink enough fluid to keep your urine pale yellow.  Return to your normal activities as told by your health care provider. Ask your health care provider what activities are safe for you. This information is not intended to replace advice given to you by your health care provider. Make sure you discuss any questions you have with your health care provider. Document Revised: 08/08/2020 Document Reviewed: 03/07/2020 Elsevier Patient Education  2021 [employer]  Scopolamine skin patches What is this medicine? SCOPOLAMINE (skoe POL a meen) is used to prevent nausea and vomiting caused by motion sickness, anesthesia and surgery. This medicine may be used for other purposes; ask your health care provider or pharmacist if you have questions. COMMON BRAND NAME(S): Transderm Scop What should I tell my health care provider before I take this medicine? They need to know if you have any of these conditions:  are scheduled to have a gastric secretion test  glaucoma  heart disease  kidney disease  liver disease  lung or breathing disease, like asthma  mental illness  prostate disease  seizures  stomach or intestine problems  trouble passing urine  an unusual or allergic reaction to scopolamine, atropine, other medicines, foods, dyes, or preservatives  pregnant or trying to get pregnant  breast-feeding How should I use this medicine? This medicine is for external use only. Follow the directions on the prescription label. Wear only 1 patch at a time. Choose an area behind the ear, that is clean, dry, hairless and free from any cuts or irritation. Wipe the area with a clean dry tissue. Peel off the plastic backing of the skin patch, trying not to touch the adhesive side with your hands. Do not cut the patches. Firmly  apply to the area you have chosen, with the metallic side of the patch to the skin and the tan-colored side showing. Once firmly in place, wash your hands well with soap and water. Do not get this medicine into your eyes. After  removing the patch, wash your hands and the area behind your ear thoroughly with soap and water. The patch will still contain some medicine after use. To avoid accidental contact or ingestion by children or pets, fold the used patch in half with the sticky side together and throw away in the trash out of the reach of children and pets. If you need to use a second patch after you remove the first, place it behind the other ear. A special MedGuide will be given to you by the pharmacist with each prescription and refill. Be sure to read this information carefully each time. Talk to your pediatrician regarding the use of this medicine in children. Special care may be needed. Overdosage: If you think you have taken too much of this medicine contact a poison control center or emergency room at once. NOTE: This medicine is only for you. Do not share this medicine with others. What if I miss a dose? This does not apply. This medicine is not for regular use. What may interact with this medicine?  alcohol  antihistamines for allergy cough and cold  atropine  certain medicines for anxiety or sleep  certain medicines for bladder problems like oxybutynin, tolterodine  certain medicines for depression like amitriptyline, fluoxetine, sertraline  certain medicines for stomach problems like dicyclomine, hyoscyamine  certain medicines for Parkinson's disease like benztropine, trihexyphenidyl  certain medicines for seizures like phenobarbital, primidone  general anesthetics like halothane, isoflurane, methoxyflurane, propofol  ipratropium  local anesthetics like lidocaine, pramoxine, tetracaine  medicines that relax muscles for surgery  phenothiazines like chlorpromazine,  mesoridazine, prochlorperazine, thioridazine  narcotic medicines for pain  other belladonna alkaloids This list may not describe all possible interactions. Give your health care provider a list of all the medicines, herbs, non-prescription drugs, or dietary supplements you use. Also tell them if you smoke, drink alcohol, or use illegal drugs. Some items may interact with your medicine. What should I watch for while using this medicine? Limit contact with water while swimming and bathing because the patch may fall off. If the patch falls off, throw it away and put a new one behind the other ear. You may get drowsy or dizzy. Do not drive, use machinery, or do anything that needs mental alertness until you know how this medicine affects you. Do not stand or sit up quickly, especially if you are an older patient. This reduces the risk of dizzy or fainting spells. Alcohol may interfere with the effect of this medicine. Avoid alcoholic drinks. Your mouth may get dry. Chewing sugarless gum or sucking hard candy, and drinking plenty of water may help. Contact your healthcare professional if the problem does not go away or is severe. This medicine may cause dry eyes and blurred vision. If you wear contact lenses, you may feel some discomfort. Lubricating drops may help. See your healthcare professional if the problem does not go away or is severe. If you are going to need surgery, an MRI, CT scan, or other procedure, tell your healthcare professional that you are using this medicine. You may need to remove the patch before the procedure. What side effects may I notice from receiving this medicine? Side effects that you should report to your doctor or health care professional as soon as possible:  allergic reactions like skin rash, itching or hives; swelling of the face, lips, or tongue  blurred vision  changes in vision  confusion  dizziness  eye pain  fast, irregular heartbeat  hallucinations,  loss  of contact with reality  nausea, vomiting  pain or trouble passing urine  restlessness  seizures  skin irritation  stomach pain Side effects that usually do not require medical attention (report to your doctor or health care professional if they continue or are bothersome):  drowsiness  dry mouth  headache  sore throat This list may not describe all possible side effects. Call your doctor for medical advice about side effects. You may report side effects to FDA at 1-800-FDA-1088. Where should I keep my medicine? Keep out of the reach of children. Store at room temperature between 20 and 25 degrees C (68 and 77 degrees F). Keep this medicine in the foil package until ready to use. Throw away any unused medicine after the expiration date. NOTE: This sheet is a summary. It may not cover all possible information. If you have questions about this medicine, talk to your doctor, pharmacist, or health care provider.  2021 Elsevier/Gold Standard (2018-02-11 16:14:46)

## 2021-02-28 ENCOUNTER — Encounter: Payer: Self-pay | Admitting: Otolaryngology

## 2021-02-28 LAB — SURGICAL PATHOLOGY

## 2021-05-09 ENCOUNTER — Ambulatory Visit: Payer: Self-pay | Admitting: Surgery

## 2021-05-09 NOTE — H&P (Signed)
[name] Simmons Appointment: 05/09/2021 1:45 PM Location: [facility] Patient #: [id number] DOB: 05-31-1951 Married / Language: English / Race: White Male  History of Present Illness [name] Sportsman MD; 05/09/2021 2:12 PM) The patient is a 70 year old male who presents with hemorrhoids. Note for "Hemorrhoids": ` ` ` The patient returns over concerns of hemorrhoids.  Patient has had hemorrhoid issues for quite some time. His seen other offices but has mostly been followed by [place] for the past decade. Seen since 2016. Followed by [facility] gastroenterology. Declined colonoscopy. Abdomen underwhelming Flo-Gard 2 years ago. Patient notes this year he's had intermittent episodes of hemorrhoidal prolapse. He wonders if it's more left-sided. He's been compliant on taking MiraLAX every day and moves his bowels once or twice a day. bouts of constipation diarrhea. his had recurrent episodes of hemorrhoids popping out for several days. at least twice. because of happen again, he wished surgical evaluation. denies much in the way of bleeding or incontinence. no diabetes or sleep apnea. does not smoke. can walk a half hour without difficulty. no history of cardiac or pulmonary issues. he was hoping i can just put a band on it be done. prior banding [facility]. he had a hemorrhoidectomy done in 2015. I lanced a thrombosed hemorrhoid in 2016.   No personal nor family history of GI/colon cancer, inflammatory bowel disease, irritable bowel syndrome, allergy such as Celiac Sprue, dietary/dairy problems, colitis, ulcers nor gastritis. No recent sick contacts/gastroenteritis. No travel outside the country. No changes in diet. No dysphagia to solids or liquids. No significant heartburn or reflux. No melena, hematemesis, coffee ground emesis. No evidence of prior gastric/peptic ulceration.     PRIOR NOTE 2016: Patient comes today for concern of symptomatically hemorrhoid.  Wonders if it thrombosed. History of diverticulitis. Underwent elective resection 2008. Had concern for hemorrhoid. Saw my partner, Dr. Abbey Chatters 2.5 years ago. Felt not to be significant hemorrhoid disease. Apparently underwent hemorrhoid surgery by Dr. Logan Bores down in [place]. Dr. Logan Bores on vacation. Therefore patient comes to our office for help with newly painful hemorrhoid x 2 weeks. He's been recommended to have screening colonoscopy by our group and his gastroenterologist Dr. Russella Dar many times and he is refused in the past.  Patient had large thrombosed hemorrhoid. This was evacuated in excess tissue excised under local anesthetic in the office 07/11/2015, one month ago. He returns today for evaluation. He's feeling much better. Did notice one episode of mild bleeding last week. No fevers or chills. Having daily bowel movements. Energy level pretty good. ` ###########################################  Pathology: ` ` ###########################################`   Problem List/Past Medical [name] Sportsman, MD; 05/09/2021 1:47 PM) HEMORRHOIDS, INTERNAL, THROMBOSED (K64.5)  Past Surgical History [name] Coots, CNA; 05/09/2021 1:44 PM) Cataract Surgery Right. Hemorrhoidectomy  Diagnostic Studies History [name] Coots, CNA; 05/09/2021 1:44 PM) Colonoscopy >10 years ago 5-10 years ago  Allergies [name] Coots, CNA; 05/09/2021 1:44 PM) No Known Drug Allergies [07/11/2015]: Allergies Reconciled  Medication History [name] Coots, CNA; 05/09/2021 1:44 PM) oxyCODONE HCl (5MG  Tablet, 1-2 Oral every four hours, as needed, Taken starting 07/11/2015) Active. Naproxen DR (500MG  Tablet DR, 1 (one) Oral two times daily, as needed, Taken starting 07/11/2015) Active. NexIUM (40MG  Capsule DR, Oral) Active. 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 No alcohol use No drug use Tobacco use Never  smoker.  Family History , CNA; 05/09/2021 1:44 PM) Family history unknown First Degree Relatives  Other Problems 07/09/2021 C.  [name] Vanvoorhis, MD; 05/09/2021 1:47 PM) Hemorrhoids     Review of Systems [name] Sportsman MD; 05/09/2021 1:47 PM) General Not Present- Appetite Loss, Chills, Fatigue, Fever, Night Sweats, Weight Gain and Weight Loss. Skin Not Present- Change in Wart/Mole, Dryness, Hives, Jaundice, New Lesions, Non-Healing Wounds, Rash and Ulcer. HEENT Not Present- Earache, Hearing Loss, Hoarseness, Nose Bleed, Oral Ulcers, Ringing in the Ears, Seasonal Allergies, Sinus Pain, Sore Throat, Visual Disturbances, Wears glasses/contact lenses and Yellow Eyes. Respiratory Not Present- Bloody sputum, Chronic Cough, Difficulty Breathing, Snoring and Wheezing. Breast Not Present- Breast Mass, Breast Pain, Nipple Discharge and Skin Changes. Cardiovascular Not Present- Chest Pain, Difficulty Breathing Lying Down, Leg Cramps, Palpitations, Rapid Heart Rate, Shortness of Breath and Swelling of Extremities. Gastrointestinal Present- Hemorrhoids. Not Present- Abdominal Pain, Bloating, Bloody Stool, Change in Bowel Habits, Chronic diarrhea, Constipation, Difficulty Swallowing, Excessive gas, Gets full quickly at meals, Indigestion, Nausea, Rectal Pain and Vomiting. Male Genitourinary Not Present- Blood in Urine, Change in Urinary Stream, Frequency, Impotence, Nocturia, Painful Urination, Urgency and Urine Leakage. Musculoskeletal Not Present- Back Pain, Joint Pain, Joint Stiffness, Muscle Pain, Muscle Weakness and Swelling of Extremities. Neurological Not Present- Decreased Memory, Fainting, Headaches, Numbness, Seizures, Tingling, Tremor, Trouble walking and Weakness. Psychiatric Not Present- Anxiety, Bipolar, Change in Sleep Pattern, Depression, Fearful and Frequent crying. Endocrine Not Present- Cold Intolerance, Excessive Hunger, Hair Changes, Heat Intolerance, Hot flashes and New  Diabetes. Hematology Not Present- Blood Thinners, Easy Bruising, Excessive bleeding, Gland problems, HIV and Persistent Infections.  Vitals (Donyelle Alston CNA; 05/09/2021 1:44 PM) 05/09/2021 1:44 PM Weight: 197 lb Height: 72in Body Surface Area: 2.12 m Body Mass Index: 26.72 kg/m  Temp.: 98.57F  Pulse: 93 (Regular)  P.OX: 96% (Room air) BP: 136/100(Sitting, Left Arm, Standard)        Physical Exam [name] Sportsman MD; 05/09/2021 2:08 PM)  General Mental Status-Alert. General Appearance-Not in acute distress. Voice-Normal.  Integumentary Global Assessment Upon inspection and palpation of skin surfaces of the - Distribution of scalp and body hair is normal. General Characteristics Overall examination of the patient's skin reveals - no rashes and no suspicious lesions.  Head and Neck Head-normocephalic, atraumatic with no lesions or palpable masses. Face Global Assessment - atraumatic, no absence of expression. Neck Global Assessment - no abnormal movements, no decreased range of motion. Trachea-midline. Thyroid Gland Characteristics - non-tender.  Eye Eyeball - Left-Extraocular movements intact, No Nystagmus - Left. Eyeball - Right-Extraocular movements intact, No Nystagmus - Right. Upper Eyelid - Left-No Cyanotic - Left. Upper Eyelid - Right-No Cyanotic - Right.  Chest and Lung Exam Inspection Accessory muscles - No use of accessory muscles in breathing.  Abdomen Note: Soft & flat  Rectal Note: No actively prolapsed hemorrhoid at this moment.  Perianal skin clean with good hygiene. No pruritis ani. No pilonidal disease. No fissure. No abscess/fistula. Normal sphincter tone.  No external hemorrhoids. No condyloma warts.  Tolerates digital and anoscopic rectal exam. Enlarged internal hemorrhoids. Right sided hemorrhoid seems to be the largest and partially prolapses out. It looks like the right posterior pile to me. Right  anterior at least grade 2 possibly grade 3. Left anterior hemorrhoid thinned out with venous congestion but does not feel like a classic thrombosed hemorrhoid. Some scarring there.  Peripheral Vascular Upper Extremity Inspection - Left - Not Gangrenous, No Petechiae. Inspection - Right - Not Gangrenous, No Petechiae.  Neurologic Neurologic evaluation reveals -normal attention span and ability to concentrate, able to name objects and repeat phrases. Appropriate fund of knowledge  and normal coordination.  Neuropsychiatric Mental status exam performed with findings of-able to articulate well with normal speech/language, rate, volume and coherence and no evidence of hallucinations, delusions, obsessions or homicidal/suicidal ideation. Orientation-oriented X3.  Musculoskeletal Global Assessment Gait and Station - normal gait and station.  Lymphatic General Lymphatics Description - No Generalized lymphadenopathy.   Results [name] Simmons(Falon Huesca C. Jantzen Pilger MD; 05/09/2021 2:10 PM) Procedures  Name Value Date ANOSCOPY, DIAGNOSTIC [phone]([id number]) [ Hemorrhoids ] Procedure Internal exam: Internal Hemorroids ( non-bleeding) prolapse Other: No actively prolapsed hemorrhoid at this moment............Marland Kitchen.Perianal skin clean with good hygiene. No pruritis ani. No pilonidal disease. No fissure. No abscess/fistula. Normal sphincter tone. ....Marland Kitchen.Marland Kitchen.No external hemorrhoids. No condyloma warts............Marland Kitchen.Tolerates digital and anoscopic rectal exam. Enlarged internal hemorrhoids. Right sided hemorrhoid seems to be the largest and partially prolapses out. It looks like the right posterior pile to me. Right anterior at least grade 2 possibly grade 3. Left anterior hemorrhoid thinned out with venous congestion but does not feel like a classic thrombosed hemorrhoid. Some scarring there.  Performed: 05/09/2021 2:08 PM    Assessment & Plan [name] Simmons(Julie Paolini C. Ronneisha Jett MD; 05/09/2021 2:08 PM)  PROLAPSED INTERNAL  HEMORRHOIDS, GRADE 3 (K64.2) Impression: Classic story for intermittently prolapsing hemorrhoid. Even though I advanced a thrombosed hemorrhoid on the right side it seems right-sided today. Less so on the left.  Given his repeated episodes of prolapse despite a decent fiber bowel regimen, I think this is going to require surgery to resolve it. He feels like it's more left-sided. It seems more right posterior to my exam. Regardless, we'll be able to more accurately assess once he is fully asleep with good anorectal examination under anesthesia. This required 6 column hemorrhoidal ligation attacks seem hemorrhoidectomy of any remaining tissues.  He was hoping I could just banding. I am skeptical that banding will work in the setting of grade 3 hemorrhoids. He has already had prior bandings. He's been compliant on a bowel regimen with soft regular bowel movements. It is still happening.  After discussing options he agrees to proceed with outpatient hemorrhoid surgery.  Current Plans ANOSCOPY, DIAGNOSTIC ([id number]) You are being scheduled for surgery- Our schedulers will call you.  You should hear from our office's scheduling department within 5 working days about the location, date, and time of surgery. We try to make accommodations for patient's preferences in scheduling surgery, but sometimes the OR schedule or the surgeon's schedule prevents us from making those accommodations.  If you have not heard from our office [phone]) in 5 working days, call the office and ask for your surgeon's nurse.  If you have other questions about your diagnosis, plan, or surgery, call the office and ask for your surgeon's nurse.  The anatomy & physiology of the anorectal region was discussed. The pathophysiology of hemorrhoids and differential diagnosis was discussed. Natural history risks without surgery was discussed. I stressed the importance of a bowel regimen to have daily soft bowel movements to  minimize progression of disease. Interventions such as sclerotherapy & banding were discussed.  The patient's symptoms are not adequately controlled by medicines and other non-operative treatments. I feel the risks & problems of no surgery outweigh the operative risks; therefore, I recommended surgery to treat the hemorrhoids by ligation, pexy, and possible resection.  Risks such as bleeding, infection, urinary difficulties, need for further treatment, heart attack, death, and other risks were discussed. I noted a good likelihood this will help address the problem. Goals of post-operative recovery were discussed as well. Possibility that this will  not correct all symptoms was explained. Post-operative pain, bleeding, constipation, and other problems after surgery were discussed. We will work to minimize complications. Educational handouts further explaining the pathology, treatment options, and bowel regimen were given as well. Questions were answered. The patient expresses understanding & wishes to proceed with surgery.   PROLAPSED INTERNAL HEMORRHOIDS, GRADE 2 (K64.1)  Current Plans Pt Education - [facility] Hemorrhoids (Leopoldo Mazzie): discussed with patient and provided information. Pt Education - Pamphlet Given - The Hemorrhoid Book: discussed with patient and provided information.  ENCOUNTER FOR PREOPERATIVE EXAMINATION FOR GENERAL SURGICAL PROCEDURE (Z01.818)  Current Plans You are being scheduled for surgery- Our schedulers will call you.  You should hear from our office's scheduling department within 5 working days about the location, date, and time of surgery. We try to make accommodations for patient's preferences in scheduling surgery, but sometimes the OR schedule or the surgeon's schedule prevents [place] from making those accommodations.  If you have not heard from our office [phone]) in 5 working days, call the office and ask for your surgeon's nurse.  If you have other  questions about your diagnosis, plan, or surgery, call the office and ask for your surgeon's nurse.  Pt Education - [facility] Rectal Prep for Anorectal outpatient/office surgery: discussed with patient and provided information. Pt Education - [facility] Rectal Surgery HCI (Boston Catarino): discussed with patient and provided information.  [name] Sportsman, MD, FACS, MASCRS  Esophageal, Gastrointestinal & Colorectal Surgery Robotic and Minimally Invasive Surgery [facility] [address], [address] [place], [place] [address] [phone] Fax [phone] Main/Paging  CONTACT INFORMATION: Weekday (9AM-5PM) concerns: Call [facility] main office at [phone] Weeknight (5PM-9AM) or Weekend/Holiday concerns: Check [email or web address] for General Surgery [facility] coverage (Please, do not use SecureChat as it is not reliable communication to operating surgeons for immediate patient care)

## 2021-05-19 ENCOUNTER — Encounter: Payer: Self-pay | Admitting: Surgery

## 2021-05-19 ENCOUNTER — Ambulatory Visit (INDEPENDENT_AMBULATORY_CARE_PROVIDER_SITE_OTHER): Payer: Medicare Other | Admitting: Surgery

## 2021-05-19 ENCOUNTER — Other Ambulatory Visit: Payer: Self-pay

## 2021-05-19 VITALS — BP 141/92 | HR 67 | Temp 98.6°F | Ht 72.0 in | Wt 197.6 lb

## 2021-05-19 DIAGNOSIS — K649 Unspecified hemorrhoids: Secondary | ICD-10-CM | POA: Diagnosis not present

## 2021-05-19 NOTE — Progress Notes (Signed)
05/19/2021  Reason for Visit: Internal and external hemorrhoids, second opinion  History of Present Illness: [name] Simmons is a 70 y.o. male with a long history of internal and external hemorrhoids presenting today for evaluation.  Has undergone prior banding procedures as well as prior hemorrhoidectomy in the past.  Recently seen by Dr. Michaell Cowing with [facility] surgery and is currently scheduled for surgery with him on 06/19/2021.  However the patient reports that he would like to cancel the surgery as he did not feel he had a good communication with Dr. Michaell Cowing during his visit.  He is also wondering if surgery is truly needed as he would rather not undergo general anesthesia.  Currently the patient reports that he feels a sensation of pressure when he is trying to have a bowel movement.  He denies any significant pain in the perianal area he denies any blood in his stool.  He denies having any significant constipation.  Past Medical History: Past Medical History:  Diagnosis Date   Colonic ulcer    Diverticulosis    GERD (gastroesophageal reflux disease)    Hiatal hernia    IBS (irritable bowel syndrome)    Internal hemorrhoids    PONV (postoperative nausea and vomiting)    Migraines and motion sickness after anesthesia also   PVC (premature ventricular contraction)    Wears hearing aid in both ears      Past Surgical History: Past Surgical History:  Procedure Laterality Date   COLON SURGERY     Patient does not remember date of sx   EXCISION MASS UPPER EXTREMETIES Right 03/07/2018   Procedure: EXCISION MASS RIGHT LONG FINGER AND RIGHT SMALL FINGER;  Surgeon: Betha Loa, MD;  Location: [facility];  Service: Orthopedics;  Laterality: Right;   IMAGE GUIDED SINUS SURGERY N/A 02/27/2021   Procedure: IMAGE GUIDED SINUS SURGERY;  Surgeon: Vernie Murders, MD;  Location: [facility];  Service: ENT;  Laterality: N/A;  need [employer] disk PLACED DISK ON OR CHARGE NURSE  DESK 3-21 KP   MAXILLARY ANTROSTOMY Bilateral 02/27/2021   Procedure: MAXILLARY ANTROSTOMY WITH TISSUE REMOVAL;  Surgeon: Vernie Murders, MD;  Location: [facility];  Service: ENT;  Laterality: Bilateral;   NASAL SEPTUM SURGERY     TONSILLECTOMY     TURBINATE REDUCTION Bilateral 02/27/2021   Procedure: TURBINATE REDUCTION;  Surgeon: Vernie Murders, MD;  Location: [facility];  Service: ENT;  Laterality: Bilateral;    Home Medications: Prior to Admission medications   Not on File    Allergies: No Known Allergies  Social History:  reports that he has never smoked. He has never used smokeless tobacco. He reports that he does not drink alcohol and does not use drugs.   Family History: History reviewed. No pertinent family history.  Review of Systems: Review of Systems  Constitutional:  Negative for chills and fever.  HENT:  Negative for hearing loss.   Respiratory:  Negative for shortness of breath.   Cardiovascular:  Negative for chest pain.  Gastrointestinal:  Negative for abdominal pain, blood in stool, constipation, diarrhea, nausea and vomiting.  Genitourinary:  Negative for dysuria.  Musculoskeletal:  Negative for myalgias.  Skin:  Negative for rash.  Neurological:  Negative for dizziness.  Psychiatric/Behavioral:  Negative for depression.    Physical Exam BP (!) 141/92   Pulse 67   Temp 98.6 F (37 C) (Oral)   Ht 6' (1.829 m)   Wt 197 lb 9.6 oz (89.6 kg)  SpO2 97%   BMI 26.80 kg/m  CONSTITUTIONAL: No acute distress HEENT:  Normocephalic, atraumatic, extraocular motion intact.  RESPIRATORY:  Normal respiratory effort without pathologic use of accessory muscles. CARDIOVASCULAR: Regular rhythm and rate. GI: The abdomen is soft, nondistended.  RECTAL: On external exam, the patient has a minimally enlarged left sided external hemorrhoid component.  On digital rectal exam, the patient also has a mildly enlarged internal component of the left lateral  column.  I do not feel that the other columns are enlarged at this point.  There is no protrusion of tissue with him straining to expulse my finger.  No gross blood. MUSCULOSKELETAL:  Normal gait and no peripheral edema. NEUROLOGIC:  Motor and sensation is grossly normal.  Cranial nerves are grossly intact. PSYCH:  Alert and oriented to person, place and time. Affect is normal.  Laboratory Analysis: No results found for this or any previous visit (from the past 24 hour(s)).  Imaging: No results found.  Assessment and Plan: This is a 70 y.o. male with a history of internal and external hemorrhoids, status post prior banding and prior hemorrhoidectomy.  - Discussed with the patient on my exam and feeling 1 column that is enlarged overall which is the left lateral component.  The external component is minimally enlarged and there is no significant pain externally from what the patient describes.  Potentially this could be related to the internal component although it does not appear to be too enlarged either.  The patient has not had a colonoscopy in many years and he has been hesitant to have 1 given 20 years ago the bowel prep "tore him up".  He is also hesitant to have general anesthesia as he feels the last time he has a very severe migraine headache which he reports was worse than the surgery itself.  I think since his main issue may be related to the internal hemorrhoid component, I will send a referral to GI for evaluation for possible banding.  Discussed with him that with his prior banding procedures, I do not know if he would be a candidate for banding again, but would defer to GI for that decision.  If at the end he's not a candidate for banding, he can call us back to set up a new appointment and schedule hemorrhoidectomy if he wishes to proceed with [place] instead of Dr. Michaell Cowing.  Face-to-face time spent with the patient and care providers was 45 minutes, with more than 50% of the time spent  counseling, educating, and coordinating care of the patient.     Howie Ill, MD [facility]

## 2021-05-19 NOTE — Patient Instructions (Addendum)
A referral to GI has been placed. They will call you to schedule an appointment.    If you have any concerns or questions, please feel free to call our office.   Hemorrhoids Hemorrhoids are swollen veins that may develop: In the butt (rectum). These are called internal hemorrhoids. Around the opening of the butt (anus). These are called external hemorrhoids. Hemorrhoids can cause pain, itching, or bleeding. Most of the time, they do not cause serious problems. They usually get better with diet changes, lifestylechanges, and other home treatments. What are the causes? This condition may be caused by: Having trouble pooping (constipation). Pushing hard (straining) to poop. Watery poop (diarrhea). Pregnancy. Being very overweight (obese). Sitting for long periods of time. Heavy lifting or other activity that causes you to strain. Anal sex. Riding a bike for a long period of time. What are the signs or symptoms? Symptoms of this condition include: Pain. Itching or soreness in the butt. Bleeding from the butt. Leaking poop. Swelling in the area. One or more lumps around the opening of your butt. How is this diagnosed? A doctor can often diagnose this condition by looking at the affected area. The doctor may also: Do an exam that involves feeling the area with a gloved hand (digital rectal exam). Examine the area inside your butt using a small tube (anoscope). Order blood tests. This may be done if you have lost a lot of blood. Have you get a test that involves looking inside the colon using a flexible tube with a camera on the end (sigmoidoscopy or colonoscopy). How is this treated? This condition can usually be treated at home. Your doctor may tell you to change what you eat, make lifestyle changes, or try home treatments. If these do not help, procedures can be done to remove the hemorrhoids or make them smaller. These may involve: Placing rubber bands at the base of the  hemorrhoids to cut off their blood supply. Injecting medicine into the hemorrhoids to shrink them. Shining a type of light energy onto the hemorrhoids to cause them to fall off. Doing surgery to remove the hemorrhoids or cut off their blood supply. Follow these instructions at home: Eating and drinking  Eat foods that have a lot of fiber in them. These include whole grains, beans, nuts, fruits, and vegetables. Ask your doctor about taking products that have added fiber (fibersupplements). Reduce the amount of fat in your diet. You can do this by: Eating low-fat dairy products. Eating less red meat. Avoiding processed foods. Drink enough fluid to keep your pee (urine) pale yellow.  Managing pain and swelling  Take a warm-water bath (sitz bath) for 20 minutes to ease pain. Do this 3-4 times a day. You may do this in a bathtub or using a portable sitz bath that fits over the toilet. If told, put ice on the painful area. It may be helpful to use ice between your warm baths. Put ice in a plastic bag. Place a towel between your skin and the bag. Leave the ice on for 20 minutes, 2-3 times a day.  General instructions Take over-the-counter and prescription medicines only as told by your doctor. Medicated creams and medicines may be used as told. Exercise often. Ask your doctor how much and what kind of exercise is best for you. Go to the bathroom when you have the urge to poop. Do not wait. Avoid pushing too hard when you poop. Keep your butt dry and clean. Use wet toilet paper  or moist towelettes after pooping. Do not sit on the toilet for a long time. Keep all follow-up visits as told by your doctor. This is important. Contact a doctor if you: Have pain and swelling that do not get better with treatment or medicine. Have trouble pooping. Cannot poop. Have pain or swelling outside the area of the hemorrhoids. Get help right away if you have: Bleeding that will not  stop. Summary Hemorrhoids are swollen veins in the butt or around the opening of the butt. They can cause pain, itching, or bleeding. Eat foods that have a lot of fiber in them. These include whole grains, beans, nuts, fruits, and vegetables. Take a warm-water bath (sitz bath) for 20 minutes to ease pain. Do this 3-4 times a day. This information is not intended to replace advice given to you by your health care provider. Make sure you discuss any questions you have with your healthcare provider.

## 2021-05-29 ENCOUNTER — Encounter (HOSPITAL_COMMUNITY): Payer: Medicare Other

## 2021-06-19 ENCOUNTER — Ambulatory Visit: Admit: 2021-06-19 | Payer: Medicare Other | Admitting: Surgery

## 2021-06-19 SURGERY — HEMORRHOIDECTOMY
Anesthesia: General

## 2021-06-23 ENCOUNTER — Other Ambulatory Visit: Payer: Self-pay

## 2021-06-26 ENCOUNTER — Other Ambulatory Visit: Payer: Self-pay

## 2021-06-26 ENCOUNTER — Ambulatory Visit (INDEPENDENT_AMBULATORY_CARE_PROVIDER_SITE_OTHER): Payer: Medicare Other | Admitting: Gastroenterology

## 2021-06-26 ENCOUNTER — Encounter: Payer: Self-pay | Admitting: Gastroenterology

## 2021-06-26 VITALS — BP 111/78 | HR 99 | Temp 97.8°F | Ht 72.0 in | Wt 194.0 lb

## 2021-06-26 DIAGNOSIS — K641 Second degree hemorrhoids: Secondary | ICD-10-CM | POA: Diagnosis not present

## 2021-06-26 NOTE — Progress Notes (Signed)
PROCEDURE NOTE: The patient presents with symptomatic grade 2 hemorrhoids, unresponsive to maximal medical therapy, requesting rubber band ligation of his/her hemorrhoidal disease.  All risks, benefits and alternative forms of therapy were described and informed consent was obtained.  In the Left Lateral Decubitus position (if anoscopy is performed) anoscopic examination revealed grade 2 hemorrhoids in the all position(s).   The decision was made to band the LL internal hemorrhoid, and the [facility] System was used to perform band ligation without complication.  Digital anorectal examination was then performed to assure proper positioning of the band, and to adjust the banded tissue as required.  The patient was discharged home without pain or other issues.  Dietary and behavioral recommendations were given and (if necessary - prescriptions were given), along with follow-up instructions.  The patient will return 3 weeks for follow-up and possible additional banding as required.  No complications were encountered and the patient tolerated the procedure well.

## 2021-06-26 NOTE — Progress Notes (Signed)
[name] Repress, MD [address]  [address]  [place], [place] [address]  Main: [phone]  Fax: [phone]    Gastroenterology Consultation  Referring Provider:     Henrene Dodge, MD Primary Care Physician:  Patient, No Pcp Per (Inactive) Primary Gastroenterologist:  Dr. Arlyss Simmons Reason for Consultation:     Symptomatic hemorrhoids        HPI:   [name] Simmons is a 70 y.o. male referred by Dr. Patient, No Pcp Per (Inactive)  for consultation & management of symptomatic hemorrhoids.  Patient has long history of internal and external hemorrhoids.  Patient reports that he had undergone hemorrhoidectomy as well as banding procedures.  Patient was seen by Dr. Michaell Cowing at [facility] surgery on 05/09/2021 for intermittent prolapse of the hemorrhoids and apparently was scheduled surgery on 7/14.  Patient had seen Dr. Aleen Campi, general surgeon for second opinion on 6/13.  He was hoping to avoid surgery or undergoing hemorrhoidectomy without anesthesia.  Patient's only concern today is that he has sensation of pressure whenever he tries to have a bowel movement.  He wants hemorrhoids to be taken care of.  He had several discussions regarding colonoscopy in the past by [facility] and he refused.  He tells me that he had bad experience after colonoscopy and it disrupted the floor that has caused IBS.  He felt like the prep ripped his colon off. He continues to refuse colonoscopy to date.  He denies any constipation or diarrhea, significant straining, rectal pain, prolapse, rectal bleeding.  He takes 1 dose of MiraLAX daily.  He has not seen PCP in last 4 to 5 years.  No known history of anemia.  Denies any weight loss, loss of appetite, abdominal bloating, change in bowel habits.  NSAIDs: None  Antiplts/Anticoagulants/Anti thrombotics: None  GI Procedures: Colonoscopy in 2003 by Dr. Claudette Head, found to have TI ulcer.  Sigmoid diverticulosis and hemorrhoids. Upper endoscopy in 2005,  found to have 2 cm hiatal hernia  Past Medical History:  Diagnosis Date   Colonic ulcer    Diverticulosis    GERD (gastroesophageal reflux disease)    Hiatal hernia    IBS (irritable bowel syndrome)    Internal hemorrhoids    PONV (postoperative nausea and vomiting)    Migraines and motion sickness after anesthesia also   PVC (premature ventricular contraction)    Wears hearing aid in both ears     Past Surgical History:  Procedure Laterality Date   COLON SURGERY     Patient does not remember date of sx   EXCISION MASS UPPER EXTREMETIES Right 03/07/2018   Procedure: EXCISION MASS RIGHT LONG FINGER AND RIGHT SMALL FINGER;  Surgeon: Betha Loa, MD;  Location: [facility];  Service: Orthopedics;  Laterality: Right;   IMAGE GUIDED SINUS SURGERY N/A 02/27/2021   Procedure: IMAGE GUIDED SINUS SURGERY;  Surgeon: Vernie Murders, MD;  Location: [facility];  Service: ENT;  Laterality: N/A;  need stryker disk PLACED DISK ON OR CHARGE NURSE DESK 3-21 KP   MAXILLARY ANTROSTOMY Bilateral 02/27/2021   Procedure: MAXILLARY ANTROSTOMY WITH TISSUE REMOVAL;  Surgeon: Vernie Murders, MD;  Location: [facility];  Service: ENT;  Laterality: Bilateral;   NASAL SEPTUM SURGERY     TONSILLECTOMY     TURBINATE REDUCTION Bilateral 02/27/2021   Procedure: TURBINATE REDUCTION;  Surgeon: Vernie Murders, MD;  Location: [facility];  Service: ENT;  Laterality: Bilateral;    Current Outpatient Medications:  cetirizine (ZYRTEC) 10 MG tablet, Take 10 mg by mouth daily., Disp: , Rfl:   History reviewed. No pertinent family history.   Social History   Tobacco Use   Smoking status: Never   Smokeless tobacco: Never  Vaping Use   Vaping Use: Never used  Substance Use Topics   Alcohol use: No   Drug use: No    Allergies as of 06/26/2021   (No Known Allergies)    Review of Systems:    All systems reviewed and negative except where noted in HPI.   Physical Exam:  BP  111/78 (BP Location: Left Arm, Patient Position: Sitting, Cuff Size: Normal)   Pulse 99   Temp 97.8 F (36.6 C) (Oral)   Ht 6' (1.829 m)   Wt 194 lb (88 kg)   BMI 26.31 kg/m  No LMP for male patient.  General:   Alert,  Well-developed, well-nourished, pleasant and cooperative in NAD Head:  Normocephalic and atraumatic. Eyes:  Sclera clear, no icterus.   Conjunctiva pink. Ears:  Normal auditory acuity. Nose:  No deformity, discharge, or lesions. Mouth:  No deformity or lesions,oropharynx pink & moist. Neck:  Supple; no masses or thyromegaly. Lungs:  Respirations even and unlabored.  Clear throughout to auscultation.   No wheezes, crackles, or rhonchi. No acute distress. Heart:  Regular rate and rhythm; no murmurs, clicks, rubs, or gallops. Abdomen:  Normal bowel sounds. Soft, non-tender and non-distended without masses, hepatosplenomegaly or hernias noted.  No guarding or rebound tenderness.   Rectal: Normal perianal exam, nontender digital rectal exam, palpable hemorrhoids.  Anoscopy revealed external hemorrhoids Msk:  Symmetrical without gross deformities. Good, equal movement & strength bilaterally. Pulses:  Normal pulses noted. Extremities:  No clubbing or edema.  No cyanosis. Neurologic:  Alert and oriented x3;  grossly normal neurologically. Skin:  Intact without significant lesions or rashes. No jaundice. Lymph Nodes:  No significant cervical adenopathy. Psych:  Alert and cooperative. Normal mood and affect.  Imaging Studies: No recent abdominal imaging  Assessment and Plan:   [name] Simmons is a 70 y.o. male with no significant past medical history, history of symptomatic hemorrhoids s/p hemorrhoidectomy and hemorrhoid banding in the past, history of GI ulcer is seen in consultation for 3 months history of grade 2 external hemorrhoids.  His primary concern is pressure/swelling in the anal canal.  Has persistently refused colonoscopy despite long conversation with him  regarding the latest bowel preps, general anesthesia and reassuring that overall benefits overweigh the risks of undergoing colonoscopy given his otherwise healthy medical condition.  He just wants the hemorrhoids to be taken care of nonsurgically, if at all possible.  I explained to him regarding the hemorrhoid ligation, the procedure, risks and benefits and he is willing to undergo.  Consent obtained, perform hemorrhoid ligation today Also, check CBC, CMP  Follow up in 3 weeks   [name] Repress, MD

## 2021-06-27 LAB — COMPREHENSIVE METABOLIC PANEL
ALT: 16 IU/L (ref 0–44)
AST: 17 IU/L (ref 0–40)
Albumin/Globulin Ratio: 1.8 (ref 1.2–2.2)
Albumin: 4.6 g/dL (ref 3.7–4.7)
Alkaline Phosphatase: 56 IU/L (ref 44–121)
BUN/Creatinine Ratio: 10 (ref 10–24)
BUN: 12 mg/dL (ref 8–27)
Bilirubin Total: 0.7 mg/dL (ref 0.0–1.2)
CO2: 21 mmol/L (ref 20–29)
Calcium: 9.8 mg/dL (ref 8.6–10.2)
Chloride: 109 mmol/L — ABNORMAL HIGH (ref 96–106)
Creatinine, Ser: 1.16 mg/dL (ref 0.76–1.27)
Globulin, Total: 2.5 g/dL (ref 1.5–4.5)
Glucose: 98 mg/dL (ref 65–99)
Potassium: 4.6 mmol/L (ref 3.5–5.2)
Sodium: 145 mmol/L — ABNORMAL HIGH (ref 134–144)
Total Protein: 7.1 g/dL (ref 6.0–8.5)
eGFR: 67 mL/min/{1.73_m2} (ref >59)

## 2021-06-27 LAB — CBC
Hematocrit: 51.1 % — ABNORMAL HIGH (ref 37.5–51.0)
Hemoglobin: 16.8 g/dL (ref 13.0–17.7)
MCH: 31.4 pg (ref 26.6–33.0)
MCHC: 32.9 g/dL (ref 31.5–35.7)
MCV: 96 fL (ref 79–97)
Platelets: 253 10*3/uL (ref 150–450)
RBC: 5.35 x10E6/uL (ref 4.14–5.80)
RDW: 12.1 % (ref 11.6–15.4)
WBC: 8.3 10*3/uL (ref 3.4–10.8)

## 2021-06-30 ENCOUNTER — Telehealth: Payer: Self-pay

## 2021-06-30 NOTE — Telephone Encounter (Signed)
Patient verbalized understanding of results  

## 2021-06-30 NOTE — Telephone Encounter (Signed)
-----   Message from Toney Reil, MD sent at 06/27/2021 12:10 PM EDT ----- Please inform patient that his labs look normal  Rohini Vanga

## 2021-07-24 ENCOUNTER — Encounter: Payer: Self-pay | Admitting: Gastroenterology

## 2021-07-24 ENCOUNTER — Ambulatory Visit (INDEPENDENT_AMBULATORY_CARE_PROVIDER_SITE_OTHER): Payer: Medicare Other | Admitting: Gastroenterology

## 2021-07-24 ENCOUNTER — Other Ambulatory Visit: Payer: Self-pay

## 2021-07-24 VITALS — BP 138/85 | HR 71 | Temp 97.6°F | Ht 72.0 in | Wt 195.4 lb

## 2021-07-24 DIAGNOSIS — K641 Second degree hemorrhoids: Secondary | ICD-10-CM | POA: Diagnosis not present

## 2021-07-24 NOTE — Progress Notes (Signed)

## 2021-08-07 ENCOUNTER — Ambulatory Visit: Payer: Medicare Other | Admitting: Gastroenterology

## 2021-08-08 ENCOUNTER — Other Ambulatory Visit: Payer: Self-pay

## 2021-08-08 ENCOUNTER — Encounter: Payer: Self-pay | Admitting: Gastroenterology

## 2021-08-08 ENCOUNTER — Ambulatory Visit (INDEPENDENT_AMBULATORY_CARE_PROVIDER_SITE_OTHER): Payer: Medicare Other | Admitting: Gastroenterology

## 2021-08-08 VITALS — BP 123/83 | HR 79 | Temp 97.3°F | Ht 72.0 in | Wt 194.8 lb

## 2021-08-08 DIAGNOSIS — K641 Second degree hemorrhoids: Secondary | ICD-10-CM

## 2021-08-08 NOTE — Progress Notes (Signed)

## 2022-02-11 ENCOUNTER — Ambulatory Visit (INDEPENDENT_AMBULATORY_CARE_PROVIDER_SITE_OTHER): Payer: Medicare Other | Admitting: Gastroenterology

## 2022-02-11 ENCOUNTER — Encounter: Payer: Self-pay | Admitting: Gastroenterology

## 2022-02-11 ENCOUNTER — Other Ambulatory Visit: Payer: Self-pay

## 2022-02-11 VITALS — BP 152/104 | HR 80 | Temp 97.8°F | Ht 72.0 in | Wt 201.2 lb

## 2022-02-11 DIAGNOSIS — K641 Second degree hemorrhoids: Secondary | ICD-10-CM

## 2022-02-11 NOTE — Progress Notes (Signed)
?  ?Arlyss Repress, MD ?[address]  ?[address]  ?[place], [place] [address]  ?Main: [phone]  ?Fax: [phone] ? ? ? ?Gastroenterology Consultation ? ?Referring Provider:     No ref. provider found ?Primary Care Physician:  Patient, No Pcp Per (Inactive) ?Primary Gastroenterologist:  Dr. Arlyss Repress ?Reason for Consultation:     Symptomatic hemorrhoids ?      ? HPI:   ?[name] Simmons is a 71 y.o. male referred by Dr. Patient, No Pcp Per (Inactive)  for consultation & management of symptomatic hemorrhoids.  Patient has long history of internal and external hemorrhoids.  Patient reports that he had undergone hemorrhoidectomy as well as banding procedures.  Patient was seen by Dr. Michaell Cowing at [facility] surgery on 05/09/2021 for intermittent prolapse of the hemorrhoids and apparently was scheduled surgery on 7/14.  Patient had seen Dr. Aleen Campi, general surgeon for second opinion on 6/13.  He was hoping to avoid surgery or undergoing hemorrhoidectomy without anesthesia.  Patient's only concern today is that he has sensation of pressure whenever he tries to have a bowel movement.  He wants hemorrhoids to be taken care of.  He had several discussions regarding colonoscopy in the past by [facility] and he refused.  He tells me that he had bad experience after colonoscopy and it disrupted the floor that has caused IBS.  He felt like the prep ripped his colon off. He continues to refuse colonoscopy to date.  He denies any constipation or diarrhea, significant straining, rectal pain, prolapse, rectal bleeding.  He takes 1 dose of MiraLAX daily.  He has not seen PCP in last 4 to 5 years.  No known history of anemia.  Denies any weight loss, loss of appetite, abdominal bloating, change in bowel habits. ? ?Follow-up visit 02/11/2022 ?Patient made a follow-up to discuss about repeat hemorrhoid ligation.  He has experienced soft tissue swelling and protrusion of the hemorrhoidal tissue associated with perianal rash  few weeks ago.  He states that the perianal rash has resolved as well as the swelling has significantly improved.  He is interested in hemorrhoid ligation today.  He underwent ligation of all 3 hemorrhoids approximately 6 months ago. ? ?NSAIDs: None ? ?Antiplts/Anticoagulants/Anti thrombotics: None ? ?GI Procedures: Colonoscopy in 2003 by Dr. Claudette Head, found to have TI ulcer.  Sigmoid diverticulosis and hemorrhoids. ?Upper endoscopy in 2005, found to have 2 cm hiatal hernia ? ?Past Medical History:  ?Diagnosis Date  ? Colonic ulcer   ? Diverticulosis   ? GERD (gastroesophageal reflux disease)   ? Hiatal hernia   ? IBS (irritable bowel syndrome)   ? Internal hemorrhoids   ? PONV (postoperative nausea and vomiting)   ? Migraines and motion sickness after anesthesia also  ? PVC (premature ventricular contraction)   ? Wears hearing aid in both ears   ? ? ?Past Surgical History:  ?Procedure Laterality Date  ? COLON SURGERY    ? Patient does not remember date of sx  ? EXCISION MASS UPPER EXTREMETIES Right 03/07/2018  ? Procedure: EXCISION MASS RIGHT LONG FINGER AND RIGHT SMALL FINGER;  Surgeon: Betha Loa, MD;  Location: [facility];  Service: Orthopedics;  Laterality: Right;  ? IMAGE GUIDED SINUS SURGERY N/A 02/27/2021  ? Procedure: IMAGE GUIDED SINUS SURGERY;  Surgeon: Vernie Murders, MD;  Location: [facility];  Service: ENT;  Laterality: N/A;  need stryker disk ?PLACED DISK ON OR CHARGE NURSE DESK 3-21 KP  ? MAXILLARY  ANTROSTOMY Bilateral 02/27/2021  ? Procedure: MAXILLARY ANTROSTOMY WITH TISSUE REMOVAL;  Surgeon: Vernie Murders, MD;  Location: [facility];  Service: ENT;  Laterality: Bilateral;  ? NASAL SEPTUM SURGERY    ? TONSILLECTOMY    ? TURBINATE REDUCTION Bilateral 02/27/2021  ? Procedure: TURBINATE REDUCTION;  Surgeon: Vernie Murders, MD;  Location: [facility];  Service: ENT;  Laterality: Bilateral;  ? ?No current outpatient medications on file. ? ?No family history on  file.  ? ?Social History  ? ?Tobacco Use  ? Smoking status: Never  ? Smokeless tobacco: Never  ?Vaping Use  ? Vaping Use: Never used  ?Substance Use Topics  ? Alcohol use: No  ? Drug use: No  ? ? ?Allergies as of 02/11/2022  ? (No Known Allergies)  ? ? ?Review of Systems:    ?All systems reviewed and negative except where noted in HPI. ? ? Physical Exam:  ?BP (!) 152/104 (BP Location: Left Arm, Patient Position: Sitting, Cuff Size: Normal)   Pulse 80   Temp 97.8 ?F (36.6 ?C) (Oral)   Ht 6' (1.829 m)   Wt 201 lb 4 oz (91.3 kg)   BMI 27.29 kg/m?  ?No LMP for male patient. ? ?General:   Alert,  Well-developed, well-nourished, pleasant and cooperative in NAD ?Head:  Normocephalic and atraumatic. ?Eyes:  Sclera clear, no icterus.   Conjunctiva pink. ?Ears:  Normal auditory acuity. ?Nose:  No deformity, discharge, or lesions. ?Mouth:  No deformity or lesions,oropharynx pink & moist. ?Neck:  Supple; no masses or thyromegaly. ?Lungs:  Respirations even and unlabored.  Clear throughout to auscultation.   No wheezes, crackles, or rhonchi. No acute distress. ?Heart:  Regular rate and rhythm; no murmurs, clicks, rubs, or gallops. ?Abdomen:  Normal bowel sounds. Soft, non-tender and non-distended without masses, hepatosplenomegaly or hernias noted.  No guarding or rebound tenderness.   ?Rectal: Normal perianal exam, nontender digital rectal exam, palpable hemorrhoids, small perianal skin tags ?Msk:  Symmetrical without gross deformities. Good, equal movement & strength bilaterally. ?Pulses:  Normal pulses noted. ?Extremities:  No clubbing or edema.  No cyanosis. ?Neurologic:  Alert and oriented x3;  grossly normal neurologically. ?Skin:  Intact without significant lesions or rashes. No jaundice. ?Psych:  Alert and cooperative. Normal mood and affect. ? ?Imaging Studies: ?No recent abdominal imaging ? ?Assessment and Plan:  ? ?[name] Simmons is a 71 y.o. male with no significant past medical history, history of symptomatic  hemorrhoids s/p hemorrhoidectomy and hemorrhoid banding in the past, history of GI ulcer is seen in consultation for recent flareup of grade 2 external hemorrhoids.  His primary concern is pressure/swelling in the anal canal.  Patient had persistently refused colonoscopy despite long conversation with him regarding the latest bowel preps, general anesthesia and reassuring that overall benefits overweigh the risks of undergoing colonoscopy given his otherwise healthy medical condition.  Patient underwent ligation of RA, RP, LL in 2022.  Given the recent flareup, it is reasonable to undergo hemorrhoid ligation today, consent obtained, perform hemorrhoid ligation today ? ? ?Follow up in 2 weeks ? ? ?Arlyss Repress, MD ? ?

## 2022-02-25 ENCOUNTER — Encounter: Payer: Self-pay | Admitting: Gastroenterology

## 2022-02-25 ENCOUNTER — Other Ambulatory Visit: Payer: Self-pay

## 2022-02-25 ENCOUNTER — Ambulatory Visit (INDEPENDENT_AMBULATORY_CARE_PROVIDER_SITE_OTHER): Payer: Medicare Other | Admitting: Gastroenterology

## 2022-02-25 VITALS — BP 135/82 | HR 61 | Temp 98.1°F | Ht 72.0 in | Wt 202.0 lb

## 2022-02-25 DIAGNOSIS — K641 Second degree hemorrhoids: Secondary | ICD-10-CM | POA: Diagnosis not present

## 2022-02-25 NOTE — Progress Notes (Signed)

## 2022-02-25 NOTE — Progress Notes (Signed)

## 2022-03-30 ENCOUNTER — Ambulatory Visit (INDEPENDENT_AMBULATORY_CARE_PROVIDER_SITE_OTHER): Payer: Medicare Other | Admitting: Gastroenterology

## 2022-03-30 ENCOUNTER — Encounter: Payer: Self-pay | Admitting: Gastroenterology

## 2022-03-30 VITALS — BP 139/84 | HR 82 | Temp 98.2°F | Ht 72.0 in | Wt 199.2 lb

## 2022-03-30 DIAGNOSIS — K641 Second degree hemorrhoids: Secondary | ICD-10-CM | POA: Diagnosis not present

## 2022-03-30 NOTE — Progress Notes (Signed)
PROCEDURE NOTE: The patient presents with symptomatic grade 2 hemorrhoids, unresponsive to maximal medical therapy, requesting rubber band ligation of his/her hemorrhoidal disease.  All risks, benefits and alternative forms of therapy were described and informed consent was obtained.  The decision was made to band the LL internal hemorrhoid, and the [facility] O'[facility] System was used to perform band ligation without complication.  Digital anorectal examination was then performed to assure proper positioning of the band, and to adjust the banded tissue as required.  The patient was discharged home without pain or other issues.  Dietary and behavioral recommendations were given and (if necessary - prescriptions were given), along with follow-up instructions.  The patient will return as needed for follow-up and possible additional banding as required.  No complications were encountered and the patient tolerated the procedure well.   

## 2022-04-20 ENCOUNTER — Telehealth: Payer: Self-pay

## 2022-04-20 MED ORDER — HYDROCORTISONE (PERIANAL) 2.5 % EX CREA: 1.0000 "application " | TOPICAL_CREAM | Freq: Two times a day (BID) | CUTANEOUS | 0 refills | Status: AC

## 2022-04-20 NOTE — Telephone Encounter (Signed)
Patient states this weekend another hemorrhoid pop out. He states he is not having any bleeding and has a occasional rectal pain. Patient is wanting to know if he needs to be seen or what you recommend   ?

## 2022-04-20 NOTE — Telephone Encounter (Signed)
He can try Anusol cream twice daily for 10 days.  If his symptoms are still there, can schedule him for another hemorrhoid banding then ? ?RV ?

## 2022-04-20 NOTE — Addendum Note (Signed)
Addended by: Radene Knee L on: 04/20/2022 01:08 PM ? ? Modules accepted: Orders ? ?

## 2022-04-20 NOTE — Telephone Encounter (Signed)
Patient wife verbalized understanding of results  ?
